# Patient Record
Sex: Male | Born: 1995
Health system: Southern US, Community
[De-identification: ages and names within clinical notes are randomized; demographics above are authoritative.]

## PROBLEM LIST (undated history)

## (undated) DIAGNOSIS — J45909 Unspecified asthma, uncomplicated: Secondary | ICD-10-CM

## (undated) DIAGNOSIS — Q549 Hypospadias, unspecified: Secondary | ICD-10-CM

## (undated) DIAGNOSIS — R112 Nausea with vomiting, unspecified: Secondary | ICD-10-CM

## (undated) DIAGNOSIS — B019 Varicella without complication: Secondary | ICD-10-CM

## (undated) HISTORY — DX: Nausea with vomiting, unspecified: R11.2

## (undated) HISTORY — DX: Hypospadias, unspecified: Q54.9

## (undated) HISTORY — PX: TONSILLECTOMY: SUR1361

## (undated) HISTORY — PX: ADENOIDECTOMY: SUR15

---
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
## 2016-03-19 ENCOUNTER — Encounter (HOSPITAL_BASED_OUTPATIENT_CLINIC_OR_DEPARTMENT_OTHER): Payer: Self-pay | Admitting: *Deleted

## 2016-03-19 DIAGNOSIS — J45909 Unspecified asthma, uncomplicated: Secondary | ICD-10-CM | POA: Insufficient documentation

## 2016-03-19 DIAGNOSIS — Z7722 Contact with and (suspected) exposure to environmental tobacco smoke (acute) (chronic): Secondary | ICD-10-CM | POA: Insufficient documentation

## 2016-03-19 DIAGNOSIS — L02211 Cutaneous abscess of abdominal wall: Secondary | ICD-10-CM | POA: Insufficient documentation

## 2016-03-19 NOTE — ED Triage Notes (Signed)
Abscess to waistline x 1 week.  Denies fever.

## 2016-03-20 ENCOUNTER — Emergency Department (HOSPITAL_BASED_OUTPATIENT_CLINIC_OR_DEPARTMENT_OTHER)
Admission: EM | Admit: 2016-03-20 | Discharge: 2016-03-20 | Disposition: A | Discharge status: Home / Self Care | Payer: Medicaid Other | Attending: Emergency Medicine | Admitting: Emergency Medicine

## 2016-03-20 DIAGNOSIS — L02211 Cutaneous abscess of abdominal wall: Secondary | ICD-10-CM

## 2016-03-20 MED ORDER — MUPIROCIN CALCIUM 2 % EX CREA
TOPICAL_CREAM | Freq: Three times a day (TID) | CUTANEOUS | Status: DC
  Filled 2016-03-20: qty 15

## 2016-03-20 MED ORDER — LIDOCAINE HCL 2 % IJ SOLN
10.0000 mL | Freq: Once | INTRAMUSCULAR | Status: AC
  Administered 2016-03-20: 200 mg
  Filled 2016-03-20: qty 20

## 2016-03-20 MED ORDER — HYDROCODONE-ACETAMINOPHEN 5-325 MG PO TABS
1.0000 | ORAL_TABLET | Freq: Once | ORAL | Status: AC
  Administered 2016-03-20: 1 via ORAL
  Filled 2016-03-20: qty 1

## 2016-03-20 MED ORDER — MUPIROCIN 2 % EX OINT
TOPICAL_OINTMENT | CUTANEOUS | Status: AC
  Administered 2016-03-20: 1
  Filled 2016-03-20: qty 22

## 2016-03-20 NOTE — ED Provider Notes (Signed)
[facility] Provider Note: Lowella Dell, MD, FACEP  CSN: [id number] MRN: [id number] ARRIVAL: 03/19/16 at 2123 ROOM: MH01/MH01   CHIEF COMPLAINT  Abscess   HISTORY OF PRESENT ILLNESS  Kevin Bryan is a 21 y.o. male with a tender swollen area on his midline abdomen at about the belt line. It is been there about a week. He has tried to squeeze it but has not got any drainage from it. Pain is moderate, worse with movement or palpation. He denies fever.   Past Medical History:  Diagnosis Date  . Asthma   . Hypospadias   . Nausea and vomiting   . Varicella     Past Surgical History:  Procedure Laterality Date  . ADENOIDECTOMY    . ESOPHAGOGASTRODUODENOSCOPY  03/01/2012   Procedure: ESOPHAGOGASTRODUODENOSCOPY (EGD);  Surgeon: Jon Gills, MD;  Location[facility] OR;  Service: Gastroenterology;  Laterality: N/A;  . TONSILLECTOMY      Family History  Problem Relation Age of Onset  . Cholelithiasis Maternal Aunt   . Learning disabilities Sister   . Asthma Mother   . Depression Mother   . Miscarriages / [place] Mother   . Asthma Maternal Grandmother   . Cancer Maternal Grandmother   . Heart disease Maternal Grandmother   . Diabetes Maternal Grandmother   . Alcohol abuse Maternal Grandfather   . Cancer Maternal Grandfather   . Arthritis Maternal Grandfather   . Heart disease Maternal Grandfather   . Diabetes Maternal Grandfather   . Hyperlipidemia Maternal Grandfather   . Hypertension Maternal Grandfather     Social History  Substance Use Topics  . Smoking status: Passive Smoke Exposure - Never Smoker  . Smokeless tobacco: Never Used     Comment: mom smokes   . Alcohol use 0.0 oz/week    Prior to Admission medications   Not on File    Allergies Bee venom and Food   REVIEW OF SYSTEMS  Negative except as noted here or in the History of Present Illness.   PHYSICAL EXAMINATION  Initial Vital Signs Blood pressure 125/63, pulse 72, temperature  98.1 F (36.7 C), temperature source Oral, resp. rate 20, height 5\' 7"  (1.702 m), weight 140 lb (63.5 kg), SpO2 100 %.  Examination General: Well-developed, well-nourished male in no acute distress; appearance consistent with age of record HENT: normocephalic; atraumatic Eyes: Normal appearance Neck: supple Heart: regular rate and rhythm Lungs: clear to auscultation bilaterally Abdomen: soft; nondistended; nontender; no masses or hepatosplenomegaly; bowel sounds present Extremities: No deformity; full range of motion; pulses normal Neurologic: Awake, alert and oriented; motor function intact in all extremities and symmetric; no facial droop Skin: Warm and dry; small fluctuant abscess midabdomen at beltline Psychiatric: Normal mood and affect   RESULTS  Summary of this visit's results, reviewed by myself:   EKG Interpretation  Date/Time:    Ventricular Rate:    PR Interval:    QRS Duration:   QT Interval:    QTC Calculation:   R Axis:     Text Interpretation:        Laboratory Studies: No results found for this or any previous visit (from the past 24 hour(s)). Imaging Studies: No results found.  ED COURSE  Nursing notes and initial vitals signs, including pulse oximetry, reviewed.  Vitals:   03/19/16 2138 03/20/16 0132  BP: 127/66 125/63  Pulse: 79 72  Resp: 20   Temp: 98.1 F (36.7 C)   TempSrc: Oral   SpO2: 100% 100%  Weight: 140 lb (63.5 kg)   Height: 5\' 7"  (1.702 m)     PROCEDURES   INCISION AND DRAINAGE Performed by: Paula LibraMOLPUS,Joziyah Roblero L Consent: Verbal consent obtained. Risks and benefits: risks, benefits and alternatives were discussed Type: abscess  Body area: Abdomen  Anesthesia: local infiltration  Incision was made with a scalpel.  Local anesthetic: lidocaine 2 % without epinephrine  Anesthetic total: One ml  Complexity: complex Blunt dissection to break up loculations  Drainage: purulent  Drainage amount: Small   Packing material:  None   Patient tolerance: Patient tolerated the procedure well with no immediate complications.     ED DIAGNOSES     ICD-9-CM ICD-10-CM   1. Abscess of abdominal wall 682.2 L02.211        Paula LibraJohn Joron Velis, MD 03/20/16 [phone]

## 2016-03-20 NOTE — ED Notes (Signed)
EDP into room 

## 2016-04-11 ENCOUNTER — Encounter: Payer: Self-pay | Admitting: Pediatrics

## 2016-04-11 ENCOUNTER — Ambulatory Visit (INDEPENDENT_AMBULATORY_CARE_PROVIDER_SITE_OTHER): Payer: BLUE CROSS/BLUE SHIELD | Admitting: Pediatrics

## 2016-04-11 VITALS — BP 106/60 | Temp 98.1°F | Wt 143.4 lb

## 2016-04-11 DIAGNOSIS — J019 Acute sinusitis, unspecified: Secondary | ICD-10-CM | POA: Diagnosis not present

## 2016-04-11 NOTE — Progress Notes (Signed)
History was provided by the patient.  Kevin Bryan is a 21 y.o. male who is here for congestion, sinus pressure x 3 days.     HPI:   Kevin Bryan reports that he has had sinus pressure since the evening of 1/27. He reports an intermittent headache, especially when he blows his nose, congestion, and a sore throat. No cough, fevers, myalgias, joint pains, new rashes. He had one bout of NBNB emesis yesterday. He has not taken any medication, but has tried essential oils (lemon, peppermint), which has helped some. He would like a note for work, as he works as a Education administratorpainter, where wears a mask and is afraid he may pass out if he is very congested and has difficulty breathing. No sick contacts.     Patient Active Problem List   Diagnosis Date Noted  . 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 Family disruption 11/05/2012  . H/O chlamydia infection 10/09/2012  . GERD (gastroesophageal reflux disease) 10/04/2012  . Headache(784.0) 01/11/2012    The following portions of the patient's history were reviewed and updated as appropriate: allergies, current medications, past family history, past medical history, past social history, past surgical history and problem list.  Physical Exam:    Vitals:   04/11/16 0931  BP: 106/60  Temp: 98.1 F (36.7 C)  TempSrc: Oral  Weight: 143 lb 6.4 oz (65 kg)   Growth parameters are noted and are appropriate for age.    General:   alert teenage male, well appearing, no acute distress  Gait:   normal  Skin:   normal and healed boil noted on lower abdomen, no drainage or surrounding erythema   Oral cavity:   lips, mucosa, and tongue normal; teeth and gums normal; posterior oropharynx clear with no exudates  Eyes:   sclerae white, pupils equal and reactive  Ears:   normal bilaterally  Neck:   no adenopathy and thyroid not enlarged, symmetric, no tenderness/mass/nodules  Lungs:  clear to auscultation bilaterally  Heart:   regular rate and rhythm, S1, S2 normal, no  murmur, click, rub or gallop  Abdomen:  soft, non-tender; bowel sounds normal; no masses,  no organomegaly  GU:  not examined  Extremities:   extremities normal, atraumatic, no cyanosis or edema  Neuro:  normal without focal findings      Assessment/Plan: Kevin Bryan is a 21 yo male presenting with three days of congestion, head ache, and sinus pressure, likely acute viral rhinosinusitis. He has had no fevers, myalgias. On exam, he is well appearing, with clear lungs, clear oropharynx, transilluminated sinuses. He has mild pain with palpation over sinuses.  Rhinosinusitis - discussed symptom management with saline irrigation, hydration, ibuprofen - return precautions discussed; will return if symptoms are not improving after 7 days  - Immunizations today: none  - Patient will call for annual physical or [facility]

## 2016-04-11 NOTE — Patient Instructions (Signed)
[facility]  [address]  [phone]

## 2016-10-18 ENCOUNTER — Emergency Department (HOSPITAL_BASED_OUTPATIENT_CLINIC_OR_DEPARTMENT_OTHER)
Admission: EM | Admit: 2016-10-18 | Discharge: 2016-10-18 | Disposition: A | Discharge status: Home / Self Care | Payer: No Typology Code available for payment source | Attending: Emergency Medicine | Admitting: Emergency Medicine

## 2016-10-18 DIAGNOSIS — R1030 Lower abdominal pain, unspecified: Secondary | ICD-10-CM

## 2016-10-18 DIAGNOSIS — A0839 Other viral enteritis: Secondary | ICD-10-CM | POA: Insufficient documentation

## 2016-10-18 DIAGNOSIS — R197 Diarrhea, unspecified: Secondary | ICD-10-CM | POA: Insufficient documentation

## 2016-10-18 DIAGNOSIS — J45909 Unspecified asthma, uncomplicated: Secondary | ICD-10-CM | POA: Insufficient documentation

## 2016-10-18 DIAGNOSIS — A084 Viral intestinal infection, unspecified: Secondary | ICD-10-CM

## 2016-10-18 DIAGNOSIS — R112 Nausea with vomiting, unspecified: Secondary | ICD-10-CM | POA: Diagnosis present

## 2016-10-18 LAB — URINALYSIS, ROUTINE W REFLEX MICROSCOPIC
Bilirubin Urine: NEGATIVE
Glucose, UA: NEGATIVE mg/dL
Hgb urine dipstick: NEGATIVE
Ketones, ur: NEGATIVE mg/dL
Leukocytes, UA: NEGATIVE
Nitrite: NEGATIVE
Protein, ur: NEGATIVE mg/dL
Specific Gravity, Urine: 1.005 (ref 1.005–1.030)
pH: 7.5 (ref 5.0–8.0)

## 2016-10-18 LAB — COMPREHENSIVE METABOLIC PANEL
ALT: 18 U/L (ref 17–63)
AST: 22 U/L (ref 15–41)
Albumin: 4.5 g/dL (ref 3.5–5.0)
Alkaline Phosphatase: 81 U/L (ref 38–126)
Anion gap: 8 (ref 5–15)
BUN: 13 mg/dL (ref 6–20)
CO2: 27 mmol/L (ref 22–32)
Calcium: 9.3 mg/dL (ref 8.9–10.3)
Chloride: 103 mmol/L (ref 101–111)
Creatinine, Ser: 0.97 mg/dL (ref 0.61–1.24)
GFR calc Af Amer: >60 mL/min (ref >60)
GFR calc non Af Amer: >60 mL/min (ref >60)
Glucose, Bld: 99 mg/dL (ref 65–99)
Potassium: 3.6 mmol/L (ref 3.5–5.1)
Sodium: 138 mmol/L (ref 135–145)
Total Bilirubin: 1.2 mg/dL (ref 0.3–1.2)
Total Protein: 7.3 g/dL (ref 6.5–8.1)

## 2016-10-18 LAB — CBC
HCT: 40.1 % (ref 39.0–52.0)
Hemoglobin: 13.6 g/dL (ref 13.0–17.0)
MCH: 30.2 pg (ref 26.0–34.0)
MCHC: 33.9 g/dL (ref 30.0–36.0)
MCV: 89.1 fL (ref 78.0–100.0)
Platelets: 174 10*3/uL (ref 150–400)
RBC: 4.5 MIL/uL (ref 4.22–5.81)
RDW: 12.2 % (ref 11.5–15.5)
WBC: 7.2 10*3/uL (ref 4.0–10.5)

## 2016-10-18 LAB — LIPASE, BLOOD: Lipase: 26 U/L (ref 11–51)

## 2016-10-18 MED ORDER — SODIUM CHLORIDE 0.9 % IV BOLUS (SEPSIS)
1000.0000 mL | Freq: Once | INTRAVENOUS | Status: AC
  Administered 2016-10-18: 1000 mL via INTRAVENOUS

## 2016-10-18 MED ORDER — ONDANSETRON 4 MG PO TBDP: 4.0000 mg | ORAL_TABLET | Freq: Three times a day (TID) | ORAL | 0 refills | Status: DC | PRN

## 2016-10-18 NOTE — ED Provider Notes (Signed)
[facility] Provider Note   CSN: [id number] Arrival date & time: 10/18/16  1029     History   Chief Complaint No chief complaint on file.   HPI Kevin Bryan is a 21 y.o. male with a PMHx of asthma, GERD, and headaches, who presents to the ED with complaints of nausea, vomiting, and diarrhea that began yesterday around 8 AM while he was at work. Patient states that he had a total of 3 episodes of nonbloody nonbilious emesis yesterday, but has had no ongoing emesis today and denies any ongoing nausea aside from when he has waves of abdominal pain. He has had a total of about 4-5 nonbloody greenish soft/looser than normal episodes of diarrhea since yesterday. He states that he has occasional waves of abdominal cramping in the lower abdomen, which he describes as mild 5/10 intermittent nonradiating pain that worsens with movement and he has not tried anything for his pain or his symptoms. States currently he has no pain, because it only happens in "waves". [facility] to arrival and was told to come here because he says when the provider did her exam he was tender in the right lower quadrant so they wanted him to "have his appendix checked out". He denies any recent travel, suspicious food intake, sick contacts, alcohol use, chronic NSAID use, or prior abdominal surgeries. He denies fevers, chills, CP, SOB, constipation, obstipation, melena, hematochezia, hematemesis, hematuria, dysuria, myalgias, arthralgias, numbness, tingling, focal weakness, or any other complaints at this time.    The history is provided by the patient and medical records. No language interpreter was used.  Diarrhea   This is a new problem. The current episode started yesterday. The problem occurs 2 to 4 times per day. The problem has not changed since onset.Diarrhea characteristics: loose. There has been no fever. Associated symptoms include abdominal pain and vomiting. Pertinent  negatives include no chills, no arthralgias and no myalgias. He has tried nothing for the symptoms. The treatment provided no relief.  Abdominal Pain   This is a new problem. The current episode started yesterday. Episode frequency: intermittent. Progression since onset: waxing/waning. The pain is associated with an unknown factor. The pain is located in the RLQ, LLQ and suprapubic region. The quality of the pain is cramping. The pain is at a severity of 5/10. The pain is mild. Associated symptoms include diarrhea, nausea and vomiting. Pertinent negatives include fever, flatus, hematochezia, melena, constipation, dysuria, hematuria, arthralgias and myalgias. The symptoms are aggravated by activity. Nothing relieves the symptoms.    Past Medical History:  Diagnosis Date  . Asthma   . Hypospadias   . Nausea and vomiting   . Varicella     Patient Active Problem List   Diagnosis Date Noted  . 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 Family disruption 11/05/2012  . H/O chlamydia infection 10/09/2012  . GERD (gastroesophageal reflux disease) 10/04/2012  . Headache(784.0) 01/11/2012    Past Surgical History:  Procedure Laterality Date  . ADENOIDECTOMY    . ESOPHAGOGASTRODUODENOSCOPY  03/01/2012   Procedure: ESOPHAGOGASTRODUODENOSCOPY (EGD);  Surgeon: Jon GillsJoseph H Clark, MD;  [facility];  Service: Gastroenterology;  Laterality: N/A;  . TONSILLECTOMY         Home Medications    Prior to Admission medications   Not on File    Family History Family History  Problem Relation Age of Onset  . Cholelithiasis Maternal Aunt   . Learning disabilities Sister   .  Asthma Mother   . Depression Mother   . Miscarriages / [place] Mother   . Asthma Maternal Grandmother   . Cancer Maternal Grandmother   . Heart disease Maternal Grandmother   . Diabetes Maternal Grandmother   . Alcohol abuse Maternal Grandfather   . Cancer Maternal Grandfather   . Arthritis Maternal Grandfather   .  Heart disease Maternal Grandfather   . Diabetes Maternal Grandfather   . Hyperlipidemia Maternal Grandfather   . Hypertension Maternal Grandfather     Social History Social History  Substance Use Topics  . Smoking status: Passive Smoke Exposure - Never Smoker  . Smokeless tobacco: Never Used     Comment: mom smokes   . Alcohol use 0.0 oz/week     Allergies   Bee venom and Food   Review of Systems Review of Systems  Constitutional: Negative for chills and fever.  Respiratory: Negative for shortness of breath.   Cardiovascular: Negative for chest pain.  Gastrointestinal: Positive for abdominal pain, diarrhea, nausea and vomiting. Negative for blood in stool, constipation, flatus, hematochezia and melena.  Genitourinary: Negative for dysuria and hematuria.  Musculoskeletal: Negative for arthralgias and myalgias.  Skin: Negative for color change.  Allergic/Immunologic: Negative for immunocompromised state.  Neurological: Negative for weakness and numbness.  Psychiatric/Behavioral: Negative for confusion.   All other systems reviewed and are negative for acute change except as noted in the HPI.    Physical Exam Updated Vital Signs BP 126/72 (BP Location: Left Arm)   Pulse (!) 52   Temp 98 F (36.7 C) (Oral)   Resp 18   Ht 5\' 6"  (1.676 m)   Wt 68 kg (150 lb)   SpO2 100%   BMI 24.21 kg/m   Physical Exam  Constitutional: He is oriented to person, place, and time. Vital signs are normal. He appears well-developed and well-nourished.  Non-toxic appearance. No distress.  Afebrile, nontoxic, NAD, sitting upright drinking mt dew, laughing with family at bedside  HENT:  Head: Normocephalic and atraumatic.  Mouth/Throat: Oropharynx is clear and moist and mucous membranes are normal.  Eyes: Conjunctivae and EOM are normal. Right eye exhibits no discharge. Left eye exhibits no discharge.  Neck: Normal range of motion. Neck supple.  Cardiovascular: Normal rate, regular rhythm,  normal heart sounds and intact distal pulses.  Exam reveals no gallop and no friction rub.   No murmur heard. Pulmonary/Chest: Effort normal and breath sounds normal. No respiratory distress. He has no decreased breath sounds. He has no wheezes. He has no rhonchi. He has no rales.  Abdominal: Soft. Normal appearance and bowel sounds are normal. He exhibits no distension. There is tenderness in the right upper quadrant and right lower quadrant. There is no rigidity, no rebound, no guarding, no CVA tenderness, no tenderness at McBurney's point and negative Murphy's sign.    Soft, nondistended, +BS throughout, with very minimal TTP to R lateral abdomen with deep palpation, but no focal RLQ/mcburney's TTP, neg psoas sign, neg foot tap test, no r/g/r, neg murphy's, no CVA TTP   Musculoskeletal: Normal range of motion.  Neurological: He is alert and oriented to person, place, and time. He has normal strength. No sensory deficit.  Skin: Skin is warm, dry and intact. No rash noted.  Psychiatric: He has a normal mood and affect.  Nursing note and vitals reviewed.    ED Treatments / Results  Labs (all labs ordered are listed, but only abnormal results are displayed) Labs Reviewed  LIPASE, BLOOD  COMPREHENSIVE  METABOLIC PANEL  CBC  URINALYSIS, ROUTINE W REFLEX MICROSCOPIC    EKG  EKG Interpretation None       Radiology No results found.  Procedures Procedures (including critical care time)  Medications Ordered in ED Medications  sodium chloride 0.9 % bolus 1,000 mL (1,000 mLs Intravenous New Bag/Given 10/18/16 1331)     Initial Impression / Assessment and Plan / ED Course  I have reviewed the triage vital signs and the nursing notes.  Pertinent labs & imaging results that were available during my care of the patient were reviewed by me and considered in my medical decision making (see chart for details).     21 y.o. male here for n/v/d x1 day and lower abd cramping  intermittently. Sent here from [facility] to "check his appendix" because when they examined him, his RLQ had some tenderness. On exam, pt sitting up in NAD, drinking a mt dew, laughing with significant other at bedside; abd exam with no distension, adequate bowel sounds, very mild TTP to R lateral abdomen but no mcburney's point TTP, nonperitoneal, neg psoas/foot tap test. Afebrile and nontoxic. History and exam highly inconsistent with appendicitis, more consistent with gastroenteritis/colitis. Shared decision making conversation had regarding options of labs only then decide on any imaging, or doing imaging immediately despite my low suspicion for appendicitis or acute emergent pathology; pt states he'd rather just wait for labs. I feel this is the most appropriate next step. Will await labs. Pt declines wanting anything for pain or nausea as these are both currently resolved.   3:30 PM Delays due to downtime, and due to pt not getting fluids given until fairly late in his ED stay. Pt continues to feel improved, and no ongoing n/v/pain, tolerating PO well. CBC WNL, no leukocytosis; CMP WNL, lipase WNL, U/A completely unremarkable. Given reassuring work up, and lack of clinical findings to be concerned about appendicitis, and with story not matching well with that, discussed again with pt that we could watch/wait vs scan today. Pt again opted for holding off on any imaging studies and watch/wait. Symptoms most consistent with viral gastroenteritis/colitis. Will send home with zofran rx, advised staying hydrated, BRAT diet, tylenol/motrin for pain, and f/up with PCP in 3-5 days for recheck of symptoms. Strict return precautions advised. I explained the diagnosis and have given explicit precautions to return to the ER including for any other new or worsening symptoms. The patient understands and accepts the medical plan as it's been dictated and I have answered their questions. Discharge instructions  concerning home care and prescriptions have been given. The patient is STABLE and is discharged to home in good condition.    Final Clinical Impressions(s) / ED Diagnoses   Final diagnoses:  Nausea vomiting and diarrhea  Lower abdominal pain  Viral gastroenteritis    New Prescriptions New Prescriptions   ONDANSETRON (ZOFRAN ODT) 4 MG DISINTEGRATING TABLET    Take 1 tablet (4 mg total) by mouth every 8 (eight) hours as needed for nausea or vomiting.     [address], [place], [place] 10/18/16 1718    Blane Ohara, MD 10/20/16 1010

## 2016-10-18 NOTE — ED Notes (Signed)
ED Provider at bedside. 

## 2016-10-18 NOTE — Discharge Instructions (Signed)
Your labs today are reassuring, your symptoms are most likely due to a viral gastroenteritis/colitis. Use zofran as prescribed, as needed for nausea. Alternate between tylenol and motrin as needed for pain. Stay well hydrated with small sips of fluids throughout the day. Follow a BRAT (banana-rice-applesauce-toast) diet as described below for the next 24-48 hours. The 'BRAT' diet is suggested, then progress to diet as tolerated as symptoms abate. Call your regular doctor if bloody stools, persistent diarrhea, persisting vomiting, fever, or worsening abdominal pain. Follow up with your regular doctor in 3-5 days for recheck of symptoms. Return to ER for changing or worsening of symptoms.

## 2016-10-18 NOTE — ED Triage Notes (Signed)
Patient states that he is was having N/V/D since last night. Denies any right now. Went to an Urgent care and when the Provider pushed on his right lower quad it hurt

## 2016-10-18 NOTE — ED Notes (Signed)
SEE DOWNTIME FORMS 

## 2016-12-29 ENCOUNTER — Other Ambulatory Visit (HOSPITAL_COMMUNITY): Payer: Self-pay

## 2018-07-15 ENCOUNTER — Other Ambulatory Visit: Payer: Self-pay

## 2018-07-15 ENCOUNTER — Emergency Department (HOSPITAL_BASED_OUTPATIENT_CLINIC_OR_DEPARTMENT_OTHER)
Admission: EM | Admit: 2018-07-15 | Discharge: 2018-07-15 | Disposition: A | Discharge status: Home / Self Care | Payer: No Typology Code available for payment source | Attending: Emergency Medicine | Admitting: Emergency Medicine

## 2018-07-15 ENCOUNTER — Encounter (HOSPITAL_BASED_OUTPATIENT_CLINIC_OR_DEPARTMENT_OTHER): Payer: Self-pay

## 2018-07-15 DIAGNOSIS — Z7722 Contact with and (suspected) exposure to environmental tobacco smoke (acute) (chronic): Secondary | ICD-10-CM | POA: Insufficient documentation

## 2018-07-15 DIAGNOSIS — J45909 Unspecified asthma, uncomplicated: Secondary | ICD-10-CM | POA: Insufficient documentation

## 2018-07-15 DIAGNOSIS — R197 Diarrhea, unspecified: Secondary | ICD-10-CM | POA: Insufficient documentation

## 2018-07-15 DIAGNOSIS — R1030 Lower abdominal pain, unspecified: Secondary | ICD-10-CM

## 2018-07-15 DIAGNOSIS — R112 Nausea with vomiting, unspecified: Secondary | ICD-10-CM

## 2018-07-15 LAB — URINALYSIS, ROUTINE W REFLEX MICROSCOPIC
Bilirubin Urine: NEGATIVE
Glucose, UA: NEGATIVE mg/dL
Hgb urine dipstick: NEGATIVE
Ketones, ur: NEGATIVE mg/dL
Leukocytes,Ua: NEGATIVE
Nitrite: NEGATIVE
Protein, ur: NEGATIVE mg/dL
Specific Gravity, Urine: 1.01 (ref 1.005–1.030)
pH: 7 (ref 5.0–8.0)

## 2018-07-15 LAB — CBC WITH DIFFERENTIAL/PLATELET
Abs Immature Granulocytes: 0.01 10*3/uL (ref 0.00–0.07)
Basophils Absolute: 0 10*3/uL (ref 0.0–0.1)
Basophils Relative: 0 %
Eosinophils Absolute: 0.1 10*3/uL (ref 0.0–0.5)
Eosinophils Relative: 1 %
HCT: 41.3 % (ref 39.0–52.0)
Hemoglobin: 13.6 g/dL (ref 13.0–17.0)
Immature Granulocytes: 0 %
Lymphocytes Relative: 26 %
Lymphs Abs: 2.1 10*3/uL (ref 0.7–4.0)
MCH: 29.7 pg (ref 26.0–34.0)
MCHC: 32.9 g/dL (ref 30.0–36.0)
MCV: 90.2 fL (ref 80.0–100.0)
Monocytes Absolute: 0.6 10*3/uL (ref 0.1–1.0)
Monocytes Relative: 7 %
Neutro Abs: 5.2 10*3/uL (ref 1.7–7.7)
Neutrophils Relative %: 66 %
Platelets: 210 10*3/uL (ref 150–400)
RBC: 4.58 MIL/uL (ref 4.22–5.81)
RDW: 12 % (ref 11.5–15.5)
WBC: 8 10*3/uL (ref 4.0–10.5)
nRBC: 0 % (ref 0.0–0.2)

## 2018-07-15 LAB — COMPREHENSIVE METABOLIC PANEL
ALT: 21 U/L (ref 0–44)
AST: 22 U/L (ref 15–41)
Albumin: 4.3 g/dL (ref 3.5–5.0)
Alkaline Phosphatase: 76 U/L (ref 38–126)
Anion gap: 5 (ref 5–15)
BUN: 16 mg/dL (ref 6–20)
CO2: 28 mmol/L (ref 22–32)
Calcium: 9.2 mg/dL (ref 8.9–10.3)
Chloride: 105 mmol/L (ref 98–111)
Creatinine, Ser: 1.12 mg/dL (ref 0.61–1.24)
GFR calc Af Amer: >60 mL/min (ref >60)
GFR calc non Af Amer: >60 mL/min (ref >60)
Glucose, Bld: 107 mg/dL — ABNORMAL HIGH (ref 70–99)
Potassium: 3.7 mmol/L (ref 3.5–5.1)
Sodium: 138 mmol/L (ref 135–145)
Total Bilirubin: 0.8 mg/dL (ref 0.3–1.2)
Total Protein: 7.3 g/dL (ref 6.5–8.1)

## 2018-07-15 LAB — LIPASE, BLOOD: Lipase: 31 U/L (ref 11–51)

## 2018-07-15 MED ORDER — DICYCLOMINE HCL 10 MG/ML IM SOLN
20.0000 mg | Freq: Once | INTRAMUSCULAR | Status: AC
  Administered 2018-07-15: 20 mg via INTRAMUSCULAR
  Filled 2018-07-15: qty 2

## 2018-07-15 MED ORDER — ONDANSETRON 4 MG PO TBDP: ORAL_TABLET | ORAL | 0 refills | Status: DC

## 2018-07-15 MED ORDER — DICYCLOMINE HCL 20 MG PO TABS: 20.0000 mg | ORAL_TABLET | Freq: Two times a day (BID) | ORAL | 0 refills | Status: DC

## 2018-07-15 MED ORDER — KETOROLAC TROMETHAMINE 30 MG/ML IJ SOLN
30.0000 mg | Freq: Once | INTRAMUSCULAR | Status: AC
  Administered 2018-07-15: 30 mg via INTRAVENOUS
  Filled 2018-07-15: qty 1

## 2018-07-15 MED ORDER — FAMOTIDINE IN NACL 20-0.9 MG/50ML-% IV SOLN
20.0000 mg | Freq: Once | INTRAVENOUS | Status: AC
  Administered 2018-07-15: 20 mg via INTRAVENOUS
  Filled 2018-07-15: qty 50

## 2018-07-15 MED ORDER — ONDANSETRON HCL 4 MG/2ML IJ SOLN
4.0000 mg | Freq: Once | INTRAMUSCULAR | Status: AC
  Administered 2018-07-15: 4 mg via INTRAVENOUS
  Filled 2018-07-15: qty 2

## 2018-07-15 MED ORDER — ALUM & MAG HYDROXIDE-SIMETH 200-200-20 MG/5ML PO SUSP
30.0000 mL | Freq: Once | ORAL | Status: AC
  Administered 2018-07-15: 30 mL via ORAL
  Filled 2018-07-15: qty 30

## 2018-07-15 MED ORDER — SODIUM CHLORIDE 0.9 % IV BOLUS
1000.0000 mL | Freq: Once | INTRAVENOUS | Status: AC
  Administered 2018-07-15: 1000 mL via INTRAVENOUS

## 2018-07-15 NOTE — Discharge Instructions (Signed)
Your labs today are reassuring, I suspect you are symptoms may be related to viral gastroenteritis.  Please use Zofran as needed for nausea and vomiting, Bentyl for cramping abdominal pain, you can also take over-the-counter Maalox.  Monitor your symptoms closely if you are having worsening abdominal pain especially if it localizes to one focal area, if your abdomen feels hard or distended, you develop fevers, persistent vomiting, blood in your vomit or stools or any other new or concerning symptoms please return to the emergency department for reevaluation.

## 2018-07-15 NOTE — ED Notes (Signed)
Pt provided fluids for po challenge.  No vomiting, does remain slightly nauseous

## 2018-07-15 NOTE — ED Provider Notes (Signed)
[facility] EMERGENCY DEPARTMENT Provider Note   CSN: [id number] Arrival date & time: 07/15/18  1128    History   Chief Complaint Chief Complaint  Patient presents with  . Emesis  . Abdominal Pain    HPI Kevin Bryan is a 23 y.o. male.     Kevin Bryan is a 23 y.o. male with a history of asthma, who presents to the emergency department for evaluation of generalized abdominal pain, nausea vomiting and diarrhea.  Symptoms started suddenly when he woke up this morning.  He reported generalized cramping abdominal pain that was initially worse in the upper quadrants and then seemed to migrate to the lower abdomen.  He reports that he has had 4 episodes of nonbloody nonbilious emesis since waking up this morning and has had 1 large loose bowel movement with no blood.  He reports pain has persisted it gets worse right before he feels like he is going to vomit he has had no vomiting since arriving in the emergency department.  He denies any fevers or chills.  No chest pain or shortness of breath.  He does report that symptoms began after he ate a lot of food last night at a large cookout.  He does report that he ate a rare steak.  He is unsure if anyone else has similar symptoms.  He was noted to be drinking soda on arrival to the emergency department.  He denies any heavy NSAID use, denies any alcohol use, no drug use.  No prior abdominal surgeries.     Past Medical History:  Diagnosis Date  . Asthma   . Hypospadias   . Nausea and vomiting   . Varicella     Patient Active Problem List   Diagnosis Date Noted  . 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 Family disruption 11/05/2012  . H/O chlamydia infection 10/09/2012  . GERD (gastroesophageal reflux disease) 10/04/2012  . Headache(784.0) 01/11/2012    Past Surgical History:  Procedure Laterality Date  . ADENOIDECTOMY    . ESOPHAGOGASTRODUODENOSCOPY  03/01/2012   Procedure: ESOPHAGOGASTRODUODENOSCOPY (EGD);  Surgeon:  Jon GillsJoseph H Clark, MD;  [facility];  Service: Gastroenterology;  Laterality: N/A;  . TONSILLECTOMY          Home Medications    Prior to Admission medications   Medication Sig Start Date End Date Taking? Authorizing Provider  dicyclomine (BENTYL) 20 MG tablet Take 1 tablet (20 mg total) by mouth 2 (two) times daily. 07/15/18   Dartha LodgeFord, Tillmon Kisling N, PA-C  ondansetron (ZOFRAN ODT) 4 MG disintegrating tablet 4mg  ODT q4 hours prn nausea/vomit 07/15/18   Dartha LodgeFord, Shiloh Southern N, PA-C    Family History Family History  Problem Relation Age of Onset  . Cholelithiasis Maternal Aunt   . Learning disabilities Sister   . Asthma Mother   . Depression Mother   . Miscarriages / [place] Mother   . Asthma Maternal Grandmother   . Cancer Maternal Grandmother   . Heart disease Maternal Grandmother   . Diabetes Maternal Grandmother   . Alcohol abuse Maternal Grandfather   . Cancer Maternal Grandfather   . Arthritis Maternal Grandfather   . Heart disease Maternal Grandfather   . Diabetes Maternal Grandfather   . Hyperlipidemia Maternal Grandfather   . Hypertension Maternal Grandfather     Social History Social History   Tobacco Use  . Smoking status: Passive Smoke Exposure - Never Smoker  . Smokeless tobacco: Never Used  . Tobacco comment: mom smokes   Substance  Use Topics  . Alcohol use: Yes    Alcohol/week: 0.0 standard drinks  . Drug use: Yes    Types: Marijuana    Comment: Tried marijuana once age 45     Allergies   Bee venom and Food   Review of Systems Review of Systems  Constitutional: Negative for chills and fever.  HENT: Negative.   Respiratory: Negative for cough and shortness of breath.   Cardiovascular: Negative for chest pain.  Gastrointestinal: Positive for abdominal pain, diarrhea, nausea and vomiting. Negative for blood in stool and constipation.  Genitourinary: Negative for dysuria and frequency.  Musculoskeletal: Negative for arthralgias and myalgias.  Skin: Negative  for color change and rash.  Neurological: Negative for dizziness, syncope and light-headedness.  All other systems reviewed and are negative.    Physical Exam Updated Vital Signs BP 128/76   Pulse 81   Temp 98.4 F (36.9 C) (Oral)   Resp 16   Ht  (1.676 m)   Wt 73.5 kg   SpO2 100%   BMI 26.15 kg/m   Physical Exam Vitals signs and nursing note reviewed.  Constitutional:      General: He is not in acute distress.    Appearance: He is well-developed and normal weight. He is not ill-appearing or diaphoretic.  HENT:     Head: Normocephalic and atraumatic.     Mouth/Throat:     Mouth: Mucous membranes are moist.     Pharynx: Oropharynx is clear.  Eyes:     General:        Right eye: No discharge.        Left eye: No discharge.     Pupils: Pupils are equal, round, and reactive to light.  Neck:     Musculoskeletal: Neck supple.  Cardiovascular:     Rate and Rhythm: Normal rate and regular rhythm.     Heart sounds: Normal heart sounds. No murmur. No friction rub. No gallop.   Pulmonary:     Effort: Pulmonary effort is normal. No respiratory distress.     Breath sounds: Normal breath sounds. No wheezing or rales.     Comments: Respirations equal and unlabored, patient able to speak in full sentences, lungs clear to auscultation bilaterally Abdominal:     General: Abdomen is flat. Bowel sounds are normal. There is no distension.     Palpations: Abdomen is soft. There is no mass.     Tenderness: There is generalized abdominal tenderness. There is no guarding.     Comments: Abdomen is soft, nondistended, bowel sounds present throughout, there is generalized tenderness throughout, most notable across the lower abdomen but there is no guarding or peritoneal signs, no Murphy sign or focal tenderness in the right lower quadrant.  Musculoskeletal:        General: No deformity.  Skin:    General: Skin is warm and dry.     Capillary Refill: Capillary refill takes less than 2  seconds.  Neurological:     Mental Status: He is alert and oriented to person, place, and time.     Coordination: Coordination normal.     Comments: Speech is clear, able to follow commands Moves extremities without ataxia, coordination intact  Psychiatric:        Mood and Affect: Mood normal.        Behavior: Behavior normal.      ED Treatments / Results  Labs (all labs ordered are listed, but only abnormal results are displayed) Labs Reviewed  COMPREHENSIVE  METABOLIC PANEL - Abnormal; Notable for the following components:      Result Value   Glucose, Bld 107 (*)    All other components within normal limits  LIPASE, BLOOD  CBC WITH DIFFERENTIAL/PLATELET  URINALYSIS, ROUTINE W REFLEX MICROSCOPIC    EKG None  Radiology No results found.  Procedures Procedures (including critical care time)  Medications Ordered in ED Medications  sodium chloride 0.9 % bolus 1,000 mL (0 mLs Intravenous Stopped 07/15/18 1319)  ondansetron (ZOFRAN) injection 4 mg (4 mg Intravenous Given 07/15/18 1212)  dicyclomine (BENTYL) injection 20 mg (20 mg Intramuscular Given 07/15/18 1218)  famotidine (PEPCID) IVPB 20 mg premix (0 mg Intravenous Stopped 07/15/18 1246)  alum & mag hydroxide-simeth (MAALOX/MYLANTA) 200-200-20 MG/5ML suspension 30 mL (30 mLs Oral Given 07/15/18 1302)  ketorolac (TORADOL) 30 MG/ML injection 30 mg (30 mg Intravenous Given 07/15/18 1302)     Initial Impression / Assessment and Plan / ED Course  I have reviewed the triage vital signs and the nursing notes.  Pertinent labs & imaging results that were available during my care of the patient were reviewed by me and considered in my medical decision making (see chart for details).  Patient presents with generalized abdominal pain with associated nausea vomiting and diarrhea which started this morning, patient was at a large cookout last night, suspect the patient may have viral gastroenteritis or foodborne illness.  He does not have any  focal area of tenderness or peritoneal signs on exam.  No focal right upper quadrant tenderness to suggest gallbladder pathology.  He has not had any vomiting since arriving here in the ED.  No hematemesis, melena or hematochezia.  No fevers or chills.  Normal vitals on arrival.  Will check abdominal labs but given reassuring abdominal exam do not feel that imaging is indicated at this time.  Will provide symptomatic treatment with IV fluids, Zofran, Bentyl, Pepcid and GI cocktail.  Labs are very reassuring, no leukocytosis, normal hemoglobin, no acute electrolyte derangements, normal renal and liver function and normal lipase, urinalysis without signs of infection.  On reevaluation patient does report some improvement in his pain and he has been able to tolerate fluids with no further episodes of emesis.  Abdominal exam remains reassuring with no focal area of tenderness or peritonitis.  At this time I feel he is stable for discharge home with continued symptomatic treatment, I have provided him with strict return precautions regarding worsening abdominal pain, fevers or persistent vomiting, he expresses understanding and agreement with this plan.  Discharged home in good condition.  Final Clinical Impressions(s) / ED Diagnoses   Final diagnoses:  Nausea vomiting and diarrhea  Lower abdominal pain    ED Discharge Orders         Ordered    ondansetron (ZOFRAN ODT) 4 MG disintegrating tablet     07/15/18 1419    dicyclomine (BENTYL) 20 MG tablet  2 times daily     07/15/18 1419           Dartha Lodge, [place] 07/15/18 1830    Little, Ambrose Finland, MD 07/16/18 [phone]

## 2018-07-15 NOTE — ED Triage Notes (Signed)
Beginning this morning, vomited, loose stool x1.  Abdominal pain right upper quadrant and epigastric area.  Drinking soda on arrival.

## 2018-08-26 ENCOUNTER — Emergency Department (HOSPITAL_BASED_OUTPATIENT_CLINIC_OR_DEPARTMENT_OTHER)
Admission: EM | Admit: 2018-08-26 | Discharge: 2018-08-26 | Disposition: A | Discharge status: Home / Self Care | Payer: Managed Care, Other (non HMO) | Attending: Emergency Medicine | Admitting: Emergency Medicine

## 2018-08-26 ENCOUNTER — Other Ambulatory Visit: Payer: Self-pay

## 2018-08-26 ENCOUNTER — Encounter (HOSPITAL_BASED_OUTPATIENT_CLINIC_OR_DEPARTMENT_OTHER): Payer: Self-pay | Admitting: *Deleted

## 2018-08-26 DIAGNOSIS — R350 Frequency of micturition: Secondary | ICD-10-CM | POA: Diagnosis not present

## 2018-08-26 DIAGNOSIS — Z7722 Contact with and (suspected) exposure to environmental tobacco smoke (acute) (chronic): Secondary | ICD-10-CM | POA: Diagnosis not present

## 2018-08-26 DIAGNOSIS — J45909 Unspecified asthma, uncomplicated: Secondary | ICD-10-CM | POA: Diagnosis not present

## 2018-08-26 DIAGNOSIS — F1210 Cannabis abuse, uncomplicated: Secondary | ICD-10-CM | POA: Insufficient documentation

## 2018-08-26 DIAGNOSIS — R300 Dysuria: Secondary | ICD-10-CM

## 2018-08-26 DIAGNOSIS — N509 Disorder of male genital organs, unspecified: Secondary | ICD-10-CM | POA: Diagnosis not present

## 2018-08-26 LAB — URINALYSIS, ROUTINE W REFLEX MICROSCOPIC
Bilirubin Urine: NEGATIVE
Glucose, UA: NEGATIVE mg/dL
Hgb urine dipstick: NEGATIVE
Ketones, ur: NEGATIVE mg/dL
Leukocytes,Ua: NEGATIVE
Nitrite: NEGATIVE
Protein, ur: NEGATIVE mg/dL
Specific Gravity, Urine: 1.02 (ref 1.005–1.030)
pH: 6.5 (ref 5.0–8.0)

## 2018-08-26 NOTE — Discharge Instructions (Addendum)
Please abstain from sexual activity until you follow-up with your STD check.  Urinalysis negative for infection today.  Hydrate as discussed.

## 2018-08-26 NOTE — ED Triage Notes (Signed)
Back pain. He thinks he has a UTI.

## 2018-08-26 NOTE — ED Provider Notes (Addendum)
[facility] EMERGENCY DEPARTMENT Provider Note   CSN: [id number] Arrival date & time: 08/26/18  1112    History   Chief Complaint Chief Complaint  Patient presents with  . Dysuria    HPI Kevin Bryan is a 23 y.o. male.     The history is provided by the patient.  Dysuria Presenting symptoms: dysuria and penile pain (3 lesions to penis for the past several days)   Context: spontaneously   Relieved by:  Nothing Worsened by:  Nothing Associated symptoms: genital lesions and urinary frequency   Associated symptoms: no abdominal pain, no fever, no hematuria, no scrotal swelling, no urinary hesitation, no urinary incontinence, no urinary retention and no vomiting   Risk factors: does not have multiple sexual partners and no STI exposure     Past Medical History:  Diagnosis Date  . Asthma   . Hypospadias   . Nausea and vomiting   . Varicella     Patient Active Problem List   Diagnosis Date Noted  . 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 Family disruption 11/05/2012  . H/O chlamydia infection 10/09/2012  . GERD (gastroesophageal reflux disease) 10/04/2012  . Headache(784.0) 01/11/2012    Past Surgical History:  Procedure Laterality Date  . ADENOIDECTOMY    . ESOPHAGOGASTRODUODENOSCOPY  03/01/2012   Procedure: ESOPHAGOGASTRODUODENOSCOPY (EGD);  Surgeon: Oletha Blend, MD;  Location: [facility];  Service: Gastroenterology;  Laterality: N/A;  . TONSILLECTOMY          Home Medications    Prior to Admission medications   Medication Sig Start Date End Date Taking? Authorizing Provider  dicyclomine (BENTYL) 20 MG tablet Take 1 tablet (20 mg total) by mouth 2 (two) times daily. 07/15/18   Jacqlyn Larsen, PA-C  ondansetron (ZOFRAN ODT) 4 MG disintegrating tablet 4mg  ODT q4 hours prn nausea/vomit 07/15/18   Jacqlyn Larsen, PA-C    Family History Family History  Problem Relation Age of Onset  . Learning disabilities Sister   . Cholelithiasis Maternal Aunt   .  Asthma Mother   . Depression Mother   . Miscarriages / [place] Mother   . Asthma Maternal Grandmother   . Cancer Maternal Grandmother   . Heart disease Maternal Grandmother   . Diabetes Maternal Grandmother   . Alcohol abuse Maternal Grandfather   . Cancer Maternal Grandfather   . Arthritis Maternal Grandfather   . Heart disease Maternal Grandfather   . Diabetes Maternal Grandfather   . Hyperlipidemia Maternal Grandfather   . Hypertension Maternal Grandfather     Social History Social History   Tobacco Use  . Smoking status: Passive Smoke Exposure - Never Smoker  . Smokeless tobacco: Never Used  . Tobacco comment: mom smokes   Substance Use Topics  . Alcohol use: Yes    Alcohol/week: 0.0 standard drinks  . Drug use: Yes    Types: Marijuana    Comment: Tried marijuana once age 6     Allergies   Bee venom and Food   Review of Systems Review of Systems  Constitutional: Negative for chills and fever.  HENT: Negative for ear pain and sore throat.   Eyes: Negative for pain and visual disturbance.  Respiratory: Negative for cough and shortness of breath.   Cardiovascular: Negative for chest pain and palpitations.  Gastrointestinal: Negative for abdominal pain and vomiting.  Genitourinary: Positive for dysuria, frequency and penile pain (3 lesions to penis for the past several days). Negative for bladder incontinence, hematuria, hesitancy and scrotal  swelling.  Musculoskeletal: Negative for arthralgias and back pain.  Skin: Negative for color change and rash.  Neurological: Negative for seizures and syncope.  All other systems reviewed and are negative.    Physical Exam Updated Vital Signs BP 129/72   Pulse 82   Temp 98 F (36.7 C) (Oral)   Resp 20   Ht 5\' 6"  (1.676 m)   Wt 72.2 kg   SpO2 100%   BMI 25.70 kg/m   Physical Exam Vitals signs and nursing note reviewed.  Constitutional:      General: He is not in acute distress.    Appearance: He is  well-developed. He is not ill-appearing.  HENT:     Head: Normocephalic and atraumatic.     Nose: Nose normal.     Mouth/Throat:     Mouth: Mucous membranes are moist.  Eyes:     Extraocular Movements: Extraocular movements intact.     Conjunctiva/sclera: Conjunctivae normal.     Pupils: Pupils are equal, round, and reactive to light.  Neck:     Musculoskeletal: Neck supple.  Cardiovascular:     Rate and Rhythm: Normal rate and regular rhythm.     Pulses: Normal pulses.     Heart sounds: Normal heart sounds. No murmur.  Pulmonary:     Effort: Pulmonary effort is normal. No respiratory distress.     Breath sounds: Normal breath sounds.  Abdominal:     General: There is no distension.     Palpations: Abdomen is soft.     Tenderness: There is no abdominal tenderness.  Genitourinary:    Scrotum/Testes: Normal.     Comments: 3 herpetic lesions to shaft of penis that are crusted over Skin:    General: Skin is warm and dry.  Neurological:     Mental Status: He is alert.      ED Treatments / Results  Labs (all labs ordered are listed, but only abnormal results are displayed) Labs Reviewed  URINALYSIS, ROUTINE W REFLEX MICROSCOPIC  GC/CHLAMYDIA PROBE AMP ([place]) [facility]    EKG None  Radiology No results found.  Procedures Procedures (including critical care time)  Medications Ordered in ED Medications - No data to display   Initial Impression / Assessment and Plan / ED Course  I have reviewed the triage vital signs and the nursing notes.  Pertinent labs & imaging results that were available during my care of the patient were reviewed by me and considered in my medical decision making (see chart for details).     Donzetta Starchustin V Vining is a 23 year old male who presents to the ED with concern for UTI.  Patient with normal vitals.  No fever.  Patient has had pain with urination for the last several days.  Has 3 herpetic lesions to his penis.  Denies any concern  for STD.  No history of herpes.  Patient with negative urinalysis.  Does not want empiric treatment for STD.  Will check with gonorrhea and Chlamydia testing by urine.  Has only one sex partner.  They do not have any symptoms.  Patient appears to have herpes type lesion to his penis or possible ingrown hairs.  These lesions already appear crusted over.  Does not appear to be syphilis.  Does not want any treatment for herpes at this time or other STDs.  Recommend abstain from sex until he finds out his STD results.  Given return precautions and discharged from ED in good condition.  This chart was  dictated using voice recognition software.  Despite best efforts to proofread,  errors can occur which can change the documentation meaning.    Final Clinical Impressions(s) / ED Diagnoses   Final diagnoses:  Dysuria    ED Discharge Orders    None       Virgina NorfolkCuratolo, Bertina Guthridge, DO 08/26/18 1221    Virgina Norfolkuratolo, Elektra Wartman, DO 08/26/18 1222

## 2018-08-27 ENCOUNTER — Ambulatory Visit (INDEPENDENT_AMBULATORY_CARE_PROVIDER_SITE_OTHER): Payer: Managed Care, Other (non HMO) | Admitting: Family Medicine

## 2018-08-27 ENCOUNTER — Encounter: Payer: Self-pay | Admitting: Family Medicine

## 2018-08-27 DIAGNOSIS — R300 Dysuria: Secondary | ICD-10-CM | POA: Diagnosis not present

## 2018-08-27 MED ORDER — PHENAZOPYRIDINE HCL 100 MG PO TABS: 100.0000 mg | ORAL_TABLET | Freq: Three times a day (TID) | ORAL | 0 refills | Status: DC | PRN
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
## 2018-08-28 ENCOUNTER — Ambulatory Visit: Payer: Managed Care, Other (non HMO) | Admitting: Internal Medicine

## 2018-08-28 ENCOUNTER — Other Ambulatory Visit (HOSPITAL_COMMUNITY)
Admission: RE | Admit: 2018-08-28 | Discharge: 2018-08-28 | Disposition: A | Discharge status: Home / Self Care | Payer: Managed Care, Other (non HMO) | Source: Ambulatory Visit | Attending: Internal Medicine | Admitting: Internal Medicine

## 2018-08-28 ENCOUNTER — Other Ambulatory Visit: Payer: Self-pay

## 2018-08-28 ENCOUNTER — Encounter: Payer: Self-pay | Admitting: Internal Medicine

## 2018-08-28 VITALS — BP 128/75 | HR 91 | Temp 98.0°F | Resp 16 | Ht 66.0 in | Wt 160.0 lb

## 2018-08-28 DIAGNOSIS — A6000 Herpesviral infection of urogenital system, unspecified: Secondary | ICD-10-CM | POA: Diagnosis not present

## 2018-08-28 DIAGNOSIS — A6001 Herpesviral infection of penis: Secondary | ICD-10-CM | POA: Diagnosis not present

## 2018-08-28 DIAGNOSIS — R399 Unspecified symptoms and signs involving the genitourinary system: Secondary | ICD-10-CM

## 2018-08-28 LAB — POC URINALSYSI DIPSTICK (AUTOMATED)
Bilirubin, UA: NEGATIVE
Blood, UA: NEGATIVE
Glucose, UA: NEGATIVE
Ketones, UA: NEGATIVE
Leukocytes, UA: NEGATIVE
Nitrite, UA: NEGATIVE
Protein, UA: NEGATIVE
Spec Grav, UA: 1.015 (ref 1.010–1.025)
Urobilinogen, UA: 0.2 E.U./dL
pH, UA: 7 (ref 5.0–8.0)

## 2018-08-28 LAB — URINALYSIS, ROUTINE W REFLEX MICROSCOPIC
Bilirubin Urine: NEGATIVE
Hgb urine dipstick: NEGATIVE
Ketones, ur: NEGATIVE
Leukocytes,Ua: NEGATIVE
Nitrite: NEGATIVE
RBC / HPF: NONE SEEN (ref 0–?)
Specific Gravity, Urine: <=1.005 — AB (ref 1.000–1.030)
Total Protein, Urine: NEGATIVE
Urine Glucose: NEGATIVE
Urobilinogen, UA: 0.2 (ref 0.0–1.0)
WBC, UA: NONE SEEN (ref 0–?)
pH: 7 (ref 5.0–8.0)

## 2018-08-28 MED ORDER — VALACYCLOVIR HCL 1 G PO TABS: 1000.0000 mg | ORAL_TABLET | Freq: Three times a day (TID) | ORAL | 0 refills | Status: DC

## 2018-08-28 NOTE — Patient Instructions (Addendum)
Get the blood work     GO TO THE FRONT DESK Schedule your next appointment   schedule a follow-up with Dr. Carmelia RollerWendling in 3 weeks  Take Valtrex as prescribed  Take Pyridium to help with the pain, will turn your urine orange in color   Genital Herpes Genital herpes is a common sexually transmitted infection (STI) that is caused by a virus. The virus spreads from person to person through sexual contact. Infection can cause itching, blisters, and sores around the genitals or rectum. Symptoms may last several days and then go away This is called an outbreak. However, the virus remains in your body, so you may have more outbreaks in the future. The time between outbreaks varies and can be months or years. Genital herpes affects men and women. It is particularly concerning for pregnant women because the virus can be passed to the baby during delivery and can cause serious problems. Genital herpes is also a concern for people who have a weak disease-fighting (immune) system. What are the causes? This condition is caused by the herpes simplex virus (HSV) type 1 or type 2. The virus may spread through:  Sexual contact with an infected person, including vaginal, anal, and oral sex.  Contact with fluid from a herpes sore.  The skin. This means that you can get herpes from an infected partner even if he or she does not have a visible sore or does not know that he or she is infected. What increases the risk? You are more likely to develop this condition if:  You have sex with many partners.  You do not use latex condoms during sex. What are the signs or symptoms? Most people do not have symptoms (asymptomatic) or have mild symptoms that may be mistaken for other skin problems. Symptoms may include:  Small red bumps near the genitals, rectum, or mouth. These bumps turn into blisters and then turn into sores.  Flu-like symptoms, including: ? Fever. ? Body aches. ? Swollen lymph  nodes. ? Headache.  Painful urination.  Pain and itching in the genital area or rectal area.  Vaginal discharge.  Tingling or shooting pain in the legs and buttocks. Generally, symptoms are more severe and last longer during the first (primary) outbreak. Flu-like symptoms are also more common during the primary outbreak. How is this diagnosed? Genital herpes may be diagnosed based on:  A physical exam.  Your medical history.  Blood tests.  A test of a fluid sample (culture) from an open sore. How is this treated? There is no cure for this condition, but treatment with antiviral medicines that are taken by mouth (orally) can do the following:  Speed up healing and relieve symptoms.  Help to reduce the spread of the virus to sexual partners.  Limit the chance of future outbreaks, or make future outbreaks shorter.  Lessen symptoms of future outbreaks. Your health care provider may also recommend pain relief medicines, such as aspirin or ibuprofen. Follow these instructions at home: Sexual activity  Do not have sexual contact during active outbreaks.  Practice safe sex. Latex condoms and male condoms may help prevent the spread of the herpes virus. General instructions  Keep the affected areas dry and clean.  Take over-the-counter and prescription medicines only as told by your health care provider.  Avoid rubbing or touching blisters and sores. If you do touch blisters or sores: ? Wash your hands thoroughly with soap and water. ? Do not touch your eyes afterward.  To  help relieve pain or itching, you may take the following actions as directed by your health care provider: ? Apply a cold, wet cloth (cold compress) to affected areas 4-6 times a day. ? Apply a substance that protects your skin and reduces bleeding (astringent). ? Apply a gel that helps relieve pain around sores (lidocaine gel). ? Take a warm, shallow bath that cleans the genital area (sitz  bath).  Keep all follow-up visits as told by your health care provider. This is important. How is this prevented?  Use condoms. Although anyone can get genital herpes during sexual contact, even with the use of a condom, a condom can provide some protection.  Avoid having multiple sexual partners.  Talk with your sexual partner about any symptoms either of you may have. Also, talk with your partner about any history of STIs.  Get tested for STIs before you have sex. Ask your partner to do the same.  Do not have sexual contact if you have symptoms of genital herpes. Contact a health care provider if:  Your symptoms are not improving with medicine.  Your symptoms return.  You have new symptoms.  You have a fever.  You have abdominal pain.  You have redness, swelling, or pain in your eye.  You notice new sores on other parts of your body.  You are a woman and experience bleeding between menstrual periods.  You have had herpes and you become pregnant or plan to become pregnant. Summary  Genital herpes is a common sexually transmitted infection (STI) that is caused by the herpes simplex virus (HSV) type 1 or type 2.  These viruses are most often spread through sexual contact with an infected person.  You are more likely to develop this condition if you have sex with many partners or you have unprotected sex.  Most people do not have symptoms (asymptomatic) or have mild symptoms that may be mistaken for other skin problems. Symptoms occur as outbreaks that may happen months or years apart.  There is no cure for this condition, but treatment with oral antiviral medicines can reduce symptoms, reduce the chance of spreading the virus to a partner, prevent future outbreaks, or shorten future outbreaks. This information is not intended to replace advice given to you by your health care provider. Make sure you discuss any questions you have with your health care provider. Document  Released: 02/25/2000 Document Revised: 01/28/2016 Document Reviewed: 01/28/2016 Elsevier Interactive Patient Education  2019 [employer].

## 2018-08-28 NOTE — Progress Notes (Signed)
Pre visit review using our clinic review tool, if applicable. No additional management support is needed unless otherwise documented below in the visit note. 

## 2018-08-28 NOTE — Progress Notes (Signed)
Subjective:    Patient ID: Kevin Bryan, male    DOB: 04/15/1995, 23 y.o.   MRN: [id number]  DOS:  08/28/2018 Type of visit - description: acute  seen at the ER 08/26/2018 with dysuria and penile discomfort.  At the ER, the physician found few herpetic lesions at the penis that were already crusted over. Subsequently was seen virtually by Dr. Nani Ravens yesterday he was recommended to come in person.  Today, the patient confirm the h/o blisters; dysuria is actually worse today, symptoms are only when he urinates.  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  History of STD about 5 years ago, has been asymptomatic since.  The patient is married, reports no high risk behaviors.   Review of Systems No fever chills No penile discharge.  No testicular pain, no gross hematuria. No flank or abdominal pain  Past Medical History:  Diagnosis Date  . Asthma   . Hypospadias   . Nausea and vomiting   . Varicella     Past Surgical History:  Procedure Laterality Date  . ADENOIDECTOMY    . ESOPHAGOGASTRODUODENOSCOPY  03/01/2012   Procedure: ESOPHAGOGASTRODUODENOSCOPY (EGD);  Surgeon: Oletha Blend, MD;  Location: [place] [place];  Service: Gastroenterology;  Laterality: N/A;  . TONSILLECTOMY      Social History   Socioeconomic History  . Marital status: Married    Spouse name: Not on file  . Number of children: Not on file  . Years of education: Not on file  . Highest education level: Not on file  Occupational History  . Not on file  Social Needs  . Financial resource strain: Not on file  . Food insecurity    Worry: Not on file    Inability: Not on file  . Transportation needs    Medical: Not on file    Non-medical: Not on file  Tobacco Use  . Smoking status: Passive Smoke Exposure - Never Smoker  . Smokeless tobacco: Never Used  . Tobacco comment: mom smokes   Substance and Sexual Activity  . Alcohol use: Yes    Alcohol/week:  0.0 standard drinks  . Drug use: Yes    Types: Marijuana    Comment: Tried marijuana once age 52  . Sexual activity: Not on file    Comment: uses condoms  Lifestyle  . Physical activity    Days per week: Not on file    Minutes per session: Not on file  . Stress: Not on file  Relationships  . Social Herbalist on phone: Not on file    Gets together: Not on file    Attends religious service: Not on file    Active member of club or organization: Not on file    Attends meetings of clubs or organizations: Not on file    Relationship status: Not on file  . Intimate partner violence    Fear of current or ex partner: Not on file    Emotionally abused: Not on file    Physically abused: Not on file    Forced sexual activity: Not on file  Other Topics Concern  . Not on file  Social History Narrative   Lives at home with mom, mom's boyfriend, and grandfather. Sister lives with boyfriend. Graduated from high school.      Allergies as of 08/28/2018      Reactions   Bee Venom Swelling   Food Itching   Maple Syrup-mouth  itches       Medication List       Accurate as of August 28, 2018 11:53 AM. If you have any questions, ask your nurse or doctor.        dicyclomine 20 MG tablet Commonly known as: BENTYL Take 1 tablet (20 mg total) by mouth 2 (two) times daily.   ondansetron 4 MG disintegrating tablet Commonly known as: Zofran ODT 4mg  ODT q4 hours prn nausea/vomit   phenazopyridine 100 MG tablet Commonly known as: PYRIDIUM Take 1 tablet (100 mg total) by mouth 3 (three) times daily as needed for pain.        Objective:   Physical Exam BP 128/75 (BP Location: Right Arm, Patient Position: Sitting, Cuff Size: Small)   Pulse 91   Temp 98 F (36.7 C) (Oral)   Resp 16   Ht 5\' 6"  (1.676 m)   Wt 160 lb (72.6 kg)   SpO2 100%   BMI 25.82 kg/m  General:   Well developed, NAD, BMI noted.  HEENT:  Normocephalic . Face symmetric, atraumatic Abdomen:  Not  distended, soft, non-tender. No rebound or rigidity.  No CVA tenderness GU: Testicles symmetric, nontender.  No nodules. Groins: No lymphadenopathies Penis: Has 3-4 dried  up ulcers at the left side of the ventral area near the glans. The visible portion of the urethra is swollen, more on the left.  No discharge. DRE: No stools found, normal sphincter tone, prostate normal in size Skin: Not pale. Not jaundice Neurologic:  alert & oriented X3.  Speech normal, gait appropriate for age and unassisted Psych--  Cognition and judgment appear intact.  Cooperative with normal attention span and concentration.  Behavior appropriate. No anxious or depressed appearing.     Assessment    23 year old male, presents with:  Herpes genitalis, first episode: Advised patient that this is a clinical diagnosis, explained to him what herpes is, this may come back, he is very contagious as long as he has lesions. We will check HIV, RPR. Urinalysis at the ER was negative but will repeat a UA, urine culture and get G&C which has not been done so far. We will prescribe Valtrex understanding that it may be late for it to help but he is quite symptomatic. Dysuria: Severe, likely related to herpes, checking a G&C, treat accordingly  Today, I spent more than 25 min with the patient: >50% of the time counseling regards the first episode of genital herpes, explaining him what it is, how it is treated, potential for return.  Also reviewing the chart

## 2018-08-29 DIAGNOSIS — A6000 Herpesviral infection of urogenital system, unspecified: Secondary | ICD-10-CM | POA: Insufficient documentation

## 2018-08-29 LAB — URINE CYTOLOGY ANCILLARY ONLY
Chlamydia: NEGATIVE
Neisseria Gonorrhea: NEGATIVE

## 2018-08-30 ENCOUNTER — Encounter: Payer: Managed Care, Other (non HMO) | Admitting: Family Medicine

## 2018-08-30 LAB — URINE CULTURE
MICRO NUMBER:: 579310
Result:: NO GROWTH
SPECIMEN QUALITY:: ADEQUATE

## 2018-08-30 LAB — RPR: RPR Ser Ql: NONREACTIVE

## 2018-08-30 LAB — HIV ANTIBODY (ROUTINE TESTING W REFLEX): HIV 1&2 Ab, 4th Generation: NONREACTIVE

## 2018-09-16 ENCOUNTER — Other Ambulatory Visit: Payer: Self-pay

## 2018-09-16 ENCOUNTER — Encounter: Payer: Self-pay | Admitting: Family Medicine

## 2018-09-16 ENCOUNTER — Ambulatory Visit (INDEPENDENT_AMBULATORY_CARE_PROVIDER_SITE_OTHER): Payer: Managed Care, Other (non HMO) | Admitting: Family Medicine

## 2018-09-16 VITALS — BP 132/80 | HR 75 | Temp 97.8°F | Ht 66.0 in | Wt 161.0 lb

## 2018-09-16 DIAGNOSIS — Z0000 Encounter for general adult medical examination without abnormal findings: Secondary | ICD-10-CM | POA: Diagnosis not present

## 2018-09-16 NOTE — Patient Instructions (Addendum)
Give [place] 2-3 business days to get the results of your labs back.   Keep the diet clean and stay active.  Do monthly self testicular checks in the shower. You are feeling for lumps/bumps that don't belong. If you feel anything like this, let me know!  Clotrimazole twice daily for 14 days treats jock itch. Baby powder helps with prevention.  Let us know if you need anything.

## 2018-09-16 NOTE — Progress Notes (Signed)
Chief Complaint  Patient presents with  . Annual Exam    Well Male Kevin Bryan is here for a complete physical.   His last physical was >1 year ago.  Current diet: in general, diet could be better.   Current exercise: active at work, play with kids, chasing goats Weight trend: stable Daytime fatigue? No. Seat belt? Yes.    Health maintenance Tetanus- Yes HIV- Yes  Past Medical History:  Diagnosis Date  . Asthma   . Hypospadias   . Nausea and vomiting   . Varicella     Past Surgical History:  Procedure Laterality Date  . ADENOIDECTOMY    . ESOPHAGOGASTRODUODENOSCOPY  03/01/2012   Procedure: ESOPHAGOGASTRODUODENOSCOPY (EGD);  Surgeon: Oletha Blend, MD;  Location: [facility];  Service: Gastroenterology;  Laterality: N/A;  . TONSILLECTOMY     Medications  Takes no meds routinely.   Allergies Allergies  Allergen Reactions  . Bee Venom Swelling  . Food Itching    Maple Syrup-mouth itches    Family History Family History  Problem Relation Age of Onset  . Learning disabilities Sister   . Cholelithiasis Maternal Aunt   . Asthma Mother   . Depression Mother   . Miscarriages / [place] Mother   . Asthma Maternal Grandmother   . Cancer Maternal Grandmother   . Heart disease Maternal Grandmother   . Diabetes Maternal Grandmother   . Alcohol abuse Maternal Grandfather   . Cancer Maternal Grandfather   . Arthritis Maternal Grandfather   . Heart disease Maternal Grandfather   . Diabetes Maternal Grandfather   . Hyperlipidemia Maternal Grandfather   . Hypertension Maternal Grandfather    Review of Systems: Constitutional: no fevers or chills Eye:  no recent significant change in vision Ear/Nose/Mouth/Throat:  Ears:  no tinnitus or hearing loss Nose/Mouth/Throat:  no complaints of nasal congestion, no sore throat Cardiovascular:  no chest pain, no palpitations Respiratory:  no cough and no shortness of breath Gastrointestinal:  no abdominal pain, no change in  bowel habits GU:  Male: negative for dysuria, frequency, and incontinence and negative for prostate symptoms Musculoskeletal/Extremities:  no pain, redness, or swelling of the joints Integumentary (Skin/Breast):  no abnormal skin lesions reported Neurologic:  no headaches, no numbness, tingling Endocrine: No unexpected weight changes Hematologic/Lymphatic:  no night sweats  Exam BP 132/80 (BP Location: Left Arm, Patient Position: Sitting, Cuff Size: Normal)   Pulse 75   Temp 97.8 F (36.6 C) (Oral)   Ht 5\' 6"  (1.676 m)   Wt 161 lb (73 kg)   SpO2 99%   BMI 25.99 kg/m  General:  well developed, well nourished, in no apparent distress Skin:  no significant moles, warts, or growths Head:  no masses, lesions, or tenderness Eyes:  pupils equal and round, sclera anicteric without injection Ears:  canals without lesions, TMs shiny without retraction, no obvious effusion, no erythema Nose:  nares patent, septum midline, mucosa normal Throat/Pharynx:  lips and gingiva without lesion; tongue and uvula midline; non-inflamed pharynx; no exudates or postnasal drainage Neck: neck supple without adenopathy, thyromegaly, or masses Lungs:  clear to auscultation, breath sounds equal bilaterally, no respiratory distress Cardio:  regular rate and rhythm, no bruits, no LE edema Abdomen:  abdomen soft, nontender; bowel sounds normal; no masses or organomegaly Genital (male): circumcised penis, no lesions or discharge; testes present bilaterally without masses or tenderness Rectal: Deferred Musculoskeletal:  symmetrical muscle groups noted without atrophy or deformity Extremities:  no clubbing, cyanosis, or edema, no  deformities, no skin discoloration Neuro:  gait normal; deep tendon reflexes normal and symmetric Psych: well oriented with normal range of affect and appropriate judgment/insight  Assessment and Plan  Well adult exam - Plan: Comprehensive metabolic panel, Lipid panel  Well 23 y.o.  male. Counseled on diet and exercise. Other orders as above. Brush teeth daily. Self testicular checks q mo.  Mentioned vesicles in groin, would be interested in getting officially checked in future for herpes with culture. He will let us know when he has them again and we will do a culture.  Follow up in 1 year pending the above workup. Come back at earliest convenience for fasting labs.  The patient voiced understanding and agreement to the plan.  [name], DO 09/16/18 3:12 PM

## 2018-09-19 ENCOUNTER — Other Ambulatory Visit (INDEPENDENT_AMBULATORY_CARE_PROVIDER_SITE_OTHER): Payer: Managed Care, Other (non HMO)

## 2018-09-19 ENCOUNTER — Other Ambulatory Visit: Payer: Self-pay

## 2018-09-19 DIAGNOSIS — Z0000 Encounter for general adult medical examination without abnormal findings: Secondary | ICD-10-CM

## 2018-09-19 LAB — COMPREHENSIVE METABOLIC PANEL
ALT: 16 U/L (ref 0–53)
AST: 17 U/L (ref 0–37)
Albumin: 4.8 g/dL (ref 3.5–5.2)
Alkaline Phosphatase: 86 U/L (ref 39–117)
BUN: 12 mg/dL (ref 6–23)
CO2: 29 mEq/L (ref 19–32)
Calcium: 9.4 mg/dL (ref 8.4–10.5)
Chloride: 102 mEq/L (ref 96–112)
Creatinine, Ser: 1.07 mg/dL (ref 0.40–1.50)
GFR: 86.62 mL/min (ref >60.00)
Glucose, Bld: 106 mg/dL — ABNORMAL HIGH (ref 70–99)
Potassium: 4 mEq/L (ref 3.5–5.1)
Sodium: 139 mEq/L (ref 135–145)
Total Bilirubin: 0.5 mg/dL (ref 0.2–1.2)
Total Protein: 7.2 g/dL (ref 6.0–8.3)

## 2018-09-19 LAB — LIPID PANEL
Cholesterol: 153 mg/dL (ref 0–200)
HDL: 36.9 mg/dL — ABNORMAL LOW (ref >39.00)
LDL Cholesterol: 79 mg/dL (ref 0–99)
NonHDL: 116.27
Total CHOL/HDL Ratio: 4
Triglycerides: 185 mg/dL — ABNORMAL HIGH (ref 0.0–149.0)
VLDL: 37 mg/dL (ref 0.0–40.0)

## 2018-11-20 ENCOUNTER — Ambulatory Visit (INDEPENDENT_AMBULATORY_CARE_PROVIDER_SITE_OTHER): Payer: Managed Care, Other (non HMO) | Admitting: Family Medicine

## 2018-11-20 ENCOUNTER — Other Ambulatory Visit: Payer: Self-pay

## 2018-11-20 ENCOUNTER — Encounter: Payer: Self-pay | Admitting: Family Medicine

## 2018-11-20 DIAGNOSIS — J3089 Other allergic rhinitis: Secondary | ICD-10-CM

## 2018-11-20 MED ORDER — FLUTICASONE PROPIONATE 50 MCG/ACT NA SUSP: 2.0000 | Freq: Every day | NASAL | 6 refills | Status: DC

## 2018-11-20 MED ORDER — LEVOCETIRIZINE DIHYDROCHLORIDE 5 MG PO TABS: 5.0000 mg | ORAL_TABLET | Freq: Every evening | ORAL | 5 refills | Status: DC

## 2018-11-20 MED ORDER — ALBUTEROL SULFATE HFA 108 (90 BASE) MCG/ACT IN AERS: 2.0000 | INHALATION_SPRAY | Freq: Four times a day (QID) | RESPIRATORY_TRACT | 2 refills | Status: AC | PRN

## 2018-11-20 NOTE — Progress Notes (Signed)
I have discussed the procedure for the virtual visit with the patient who has given consent to proceed with assessment and treatment.   Pt feels that his allergies are causing the issues. Was told he was allergic to maple syrup [place] thinks this is causing some issues.   Pt unable to obtain vitals.   Davis Gourd, CMA

## 2018-11-20 NOTE — Progress Notes (Signed)
Chief Complaint  Patient presents with  . congestion    chest and nasal, mostly mornigns and late night  . Sore Throat    pot nasal drainage  . Headache    Geryl Rankins here for allergy complaints. Due to COVID-19 pandemic, we are interacting via web portal for an electronic face-to-face visit. I verified patient's ID using 2 identifiers. Patient agreed to proceed with visit via this method. Patient is at work, I am at office. Patient and I are present for visit.   Duration: 10 days  Associated symptoms: sinus congestion, itchy watery eyes, wheezing and shortness of breath Denies: sinus pain, rhinorrhea, ear pain, ear drainage, sore throat, myalgia and fevers Treatment to date: Benadryl, Sudafed Sick contacts: No  ROS:  Const: Denies fevers HEENT: As noted in HPI Lungs: +wheezing  Past Medical History:  Diagnosis Date  . Asthma   . Hypospadias   . Nausea and vomiting   . Varicella    Exam No conversational dyspnea Age appropriate judgment and insight Nml affect and mood  Seasonal allergic rhinitis due to other allergic trigger - Plan: fluticasone (FLONASE) 50 MCG/ACT nasal spray, levocetirizine (XYZAL) 5 MG tablet, albuterol (VENTOLIN HFA) 108 (90 Base) MCG/ACT inhaler  OTC options send via MyChart. F/u prn.  Pt voiced understanding and agreement to the plan.  [place], DO 11/20/18 10:44 AM

## 2018-12-06 ENCOUNTER — Encounter: Payer: Self-pay | Admitting: Family Medicine

## 2018-12-06 ENCOUNTER — Ambulatory Visit (INDEPENDENT_AMBULATORY_CARE_PROVIDER_SITE_OTHER): Payer: Managed Care, Other (non HMO) | Admitting: Family Medicine

## 2018-12-06 ENCOUNTER — Other Ambulatory Visit: Payer: Self-pay

## 2018-12-06 DIAGNOSIS — A084 Viral intestinal infection, unspecified: Secondary | ICD-10-CM | POA: Insufficient documentation

## 2018-12-06 DIAGNOSIS — R112 Nausea with vomiting, unspecified: Secondary | ICD-10-CM

## 2018-12-06 MED ORDER — ONDANSETRON 4 MG PO TBDP: 4.0000 mg | ORAL_TABLET | Freq: Three times a day (TID) | ORAL | 0 refills | Status: DC | PRN

## 2018-12-06 NOTE — Progress Notes (Signed)
Virtual Visit via Video Note  I connected with Geryl Rankins on 12/06/18 at 11:20 AM EDT by a video enabled telemedicine application and verified that I am speaking with the correct person using two identifiers.  Location: Patient: in car Provider: office    I discussed the limitations of evaluation and management by telemedicine and the availability of in person appointments. The patient expressed understanding and agreed to proceed.  History of Present Illness: Pt is in his car driving.  He did pull over to speak to me.  He was tested for covid yesterday at [facility] after being sent home from work for vomiting and diarrhea.  He has had no fever.  His test was neg He has been out of work since yesterday No other complaints.     Observations/Objective: No vitals obtained No fever Pt is in nad  Assessment and Plan: 1. Nausea and vomiting, intractability of vomiting not specified, unspecified vomiting type zofran refilled If this does not help pt was instructed to go to Er for IVf  - ondansetron (ZOFRAN ODT) 4 MG disintegrating tablet; Take 1 tablet (4 mg total) by mouth every 8 (eight) hours as needed for nausea or vomiting.  Dispense: 15 tablet; Refill: 0  Follow Up Instructions:    I discussed the assessment and treatment plan with the patient. The patient was provided an opportunity to ask questions and all were answered. The patient agreed with the plan and demonstrated an understanding of the instructions.   The patient was advised to call back or seek an in-person evaluation if the symptoms worsen or if the condition fails to improve as anticipated.  I provided 15 minutes of non-face-to-face time during this encounter.   Ann Held, DO

## 2018-12-06 NOTE — Assessment & Plan Note (Addendum)
covid was neg at [facility]  Clear liquids pedialyte ice pops zofran prn n/v If no relief with zofran-- go to ER

## 2019-02-26 ENCOUNTER — Telehealth: Payer: Self-pay | Admitting: *Deleted

## 2019-02-26 NOTE — Telephone Encounter (Signed)
Called left message to call back 

## 2019-02-26 NOTE — Telephone Encounter (Signed)
Copied from [place] [id number]. Topic: Appointment Scheduling - Prior Auth Required for Appointment >> Feb 26, 2019  8:50 AM Robina Ade, Helene Kelp D wrote: No appointment has been scheduled. Patient is requesting same day appointment virtual. Per scheduling protocol, this appointment requires a prior authorization prior to scheduling.  Route to department's PEC pool.

## 2019-02-26 NOTE — Telephone Encounter (Signed)
Called both numbers left detailed message to call back to schedule appointment

## 2019-02-28 NOTE — Telephone Encounter (Signed)
Called again and unable to speak to the patient. Left message

## 2019-07-14 ENCOUNTER — Other Ambulatory Visit: Payer: Self-pay

## 2019-07-14 ENCOUNTER — Encounter: Payer: Self-pay | Admitting: Family Medicine

## 2019-07-14 ENCOUNTER — Ambulatory Visit: Payer: Managed Care, Other (non HMO) | Admitting: Family Medicine

## 2019-07-14 VITALS — BP 110/70 | HR 90 | Temp 97.0°F | Ht 66.0 in | Wt 159.0 lb

## 2019-07-14 DIAGNOSIS — G4700 Insomnia, unspecified: Secondary | ICD-10-CM

## 2019-07-14 DIAGNOSIS — R0681 Apnea, not elsewhere classified: Secondary | ICD-10-CM | POA: Diagnosis not present

## 2019-07-14 MED ORDER — TRAZODONE HCL 50 MG PO TABS: 25.0000 mg | ORAL_TABLET | Freq: Every evening | ORAL | 3 refills | Status: DC | PRN

## 2019-07-14 NOTE — Patient Instructions (Addendum)
Sleep Hygiene Tips:  Do not watch TV or look at screens within 1 hour of going to bed. If you do, make sure there is a blue light filter (nighttime mode) involved.  Try to go to bed around the same time every night. Wake up at the same time within 1 hour of regular time. Ex: If you wake up at 7 AM for work, do not sleep past 8 AM on days that you don't work.  Do not drink alcohol before bedtime.  Do not consume caffeine-containing beverages after noon or within 9 hours of intended bedtime.  Get regular exercise/physical activity in your life, but not within 2 hours of planned bedtime.  Do not take naps.   Do not eat within 2 hours of planned bedtime.  Melatonin, 3-5 mg 30-60 minutes before planned bedtime may be helpful.   The bed should be for sleep or sex only. If after 20-30 minutes you are unable to fall asleep, get up and do something relaxing. Do this until you feel ready to go to sleep again.   Sleep is important to [place] all. Getting good sleep is imperative to adequate functioning during the day. Work with our counselors who are trained to help people obtain quality sleep. Call [phone] to schedule an appointment or if you are curious about insurance coverage/cost.  If you do not hear anything about your referral in the next 1-2 weeks, call our office and ask for an update.  Let us know if you need anything.

## 2019-07-14 NOTE — Progress Notes (Signed)
Chief Complaint  Patient presents with  . Insomnia    Subjective: Patient is a 24 y.o. male here for sleep issue.  2.5-3 mo ago, pt started having difficulty falling asleep. Will also wake up and have difficulty falling asleep again. Feels very tired. 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 Needs to get around 5-7 hrs nightly, has been getting 2-3 hrs nightly lately. Does not nap. No caffeine after noon. Does not consume alcohol regularly. Has been stressful in past week, not at onset of issue. Does not watch TV, use phone or tablet, sleeps in dark room. +famhx of sleep apnea. +hx of anxiety/depression.   Past Medical History:  Diagnosis Date  . Asthma   . Hypospadias   . Nausea and vomiting   . Varicella     Objective: BP 110/70 (BP Location: Right Arm, Patient Position: Sitting, Cuff Size: Normal)   Pulse 90   Temp (!) 97 F (36.1 C) (Temporal)   Ht 5\' 6"  (1.676 m)   Wt 159 lb (72.1 kg)   SpO2 97%   BMI 25.66 kg/m  General: Awake, appears stated age HEENT: MMM, EOMi, Mallampati IV Heart: RRR Lungs: CTAB, no rales, wheezes or rhonchi. No accessory muscle use Psych: Age appropriate judgment and insight, normal affect and mood  Assessment and Plan: Witnessed apneic spells - Plan: Ambulatory referral to Pulmonology  Insomnia, unspecified type - Plan: traZODone (DESYREL) 50 MG tablet  1- OSA eval. 2- Start trazodone. Sleep hygiene info verbalized and written down. Counseling info also provided.  F/u in 6 weeks to check that.  The patient voiced understanding and agreement to the plan.  [place], DO 07/14/19  2:36 PM

## 2019-08-25 ENCOUNTER — Ambulatory Visit: Payer: Managed Care, Other (non HMO) | Admitting: Family Medicine

## 2019-09-17 ENCOUNTER — Encounter: Payer: Managed Care, Other (non HMO) | Admitting: Family Medicine

## 2019-11-07 ENCOUNTER — Encounter (HOSPITAL_BASED_OUTPATIENT_CLINIC_OR_DEPARTMENT_OTHER): Payer: Self-pay | Admitting: Emergency Medicine

## 2019-11-07 ENCOUNTER — Other Ambulatory Visit: Payer: Self-pay

## 2019-11-07 ENCOUNTER — Emergency Department (HOSPITAL_BASED_OUTPATIENT_CLINIC_OR_DEPARTMENT_OTHER)
Admission: EM | Admit: 2019-11-07 | Discharge: 2019-11-07 | Disposition: A | Discharge status: Home / Self Care | Attending: Emergency Medicine | Admitting: Emergency Medicine

## 2019-11-07 DIAGNOSIS — Y929 Unspecified place or not applicable: Secondary | ICD-10-CM | POA: Diagnosis not present

## 2019-11-07 DIAGNOSIS — W208XXA Other cause of strike by thrown, projected or falling object, initial encounter: Secondary | ICD-10-CM | POA: Diagnosis not present

## 2019-11-07 DIAGNOSIS — Y999 Unspecified external cause status: Secondary | ICD-10-CM | POA: Insufficient documentation

## 2019-11-07 DIAGNOSIS — Y939 Activity, unspecified: Secondary | ICD-10-CM | POA: Insufficient documentation

## 2019-11-07 DIAGNOSIS — S64492A Injury of digital nerve of right middle finger, initial encounter: Secondary | ICD-10-CM | POA: Diagnosis not present

## 2019-11-07 DIAGNOSIS — S61212A Laceration without foreign body of right middle finger without damage to nail, initial encounter: Secondary | ICD-10-CM | POA: Diagnosis not present

## 2019-11-07 DIAGNOSIS — Z7951 Long term (current) use of inhaled steroids: Secondary | ICD-10-CM | POA: Diagnosis not present

## 2019-11-07 DIAGNOSIS — Z7722 Contact with and (suspected) exposure to environmental tobacco smoke (acute) (chronic): Secondary | ICD-10-CM | POA: Diagnosis not present

## 2019-11-07 DIAGNOSIS — J45909 Unspecified asthma, uncomplicated: Secondary | ICD-10-CM | POA: Insufficient documentation

## 2019-11-07 DIAGNOSIS — S60942A Unspecified superficial injury of right middle finger, initial encounter: Secondary | ICD-10-CM | POA: Diagnosis present

## 2019-11-07 MED ORDER — CEPHALEXIN 500 MG PO CAPS: 500.0000 mg | ORAL_CAPSULE | Freq: Four times a day (QID) | ORAL | 0 refills | Status: DC

## 2019-11-07 MED ORDER — LIDOCAINE-EPINEPHRINE 2 %-1:100000 IJ SOLN: 20.0000 mL | Freq: Once | INTRAMUSCULAR | Status: DC

## 2019-11-07 MED ORDER — CEPHALEXIN 250 MG PO CAPS
1000.0000 mg | ORAL_CAPSULE | Freq: Once | ORAL | Status: AC
  Administered 2019-11-07: 1000 mg via ORAL
  Filled 2019-11-07: qty 4

## 2019-11-07 MED ORDER — LIDOCAINE-EPINEPHRINE (PF) 2 %-1:200000 IJ SOLN
INTRAMUSCULAR | Status: AC
  Filled 2019-11-07: qty 20

## 2019-11-07 NOTE — ED Provider Notes (Signed)
[facility] Provider Note: Lowella Dell, MD, FACEP  CSN: [id number] MRN: [id number] ARRIVAL: 11/07/19 at 0444 ROOM: MH05/[facility]05   CHIEF COMPLAINT  Laceration   HISTORY OF PRESENT ILLNESS  11/07/19 4:56 AM Kevin Bryan is a 24 y.o. male who had a piece of metal fall onto his right middle finger at work just prior to arrival.  He has a laceration of the middle phalanx.  He rates associated pain is a 6 out of 10, throbbing in nature, worse with movement or palpation.  He has numbness distal to the cut but bleeding is controlled.  His tetanus is up-to-date.   Past Medical History:  Diagnosis Date  . Asthma   . Hypospadias   . Nausea and vomiting   . Varicella     Past Surgical History:  Procedure Laterality Date  . ADENOIDECTOMY    . ESOPHAGOGASTRODUODENOSCOPY  03/01/2012   Procedure: ESOPHAGOGASTRODUODENOSCOPY (EGD);  Surgeon: Jon Gills, MD;  Location: [facility] OR;  Service: Gastroenterology;  Laterality: N/A;  . TONSILLECTOMY      Family History  Problem Relation Age of Onset  . Learning disabilities Sister   . Cholelithiasis Maternal Aunt   . Asthma Mother   . Depression Mother   . Miscarriages / [place] Mother   . Asthma Maternal Grandmother   . Cancer Maternal Grandmother   . Heart disease Maternal Grandmother   . Diabetes Maternal Grandmother   . Alcohol abuse Maternal Grandfather   . Cancer Maternal Grandfather   . Arthritis Maternal Grandfather   . Heart disease Maternal Grandfather   . Diabetes Maternal Grandfather   . Hyperlipidemia Maternal Grandfather   . Hypertension Maternal Grandfather     Social History   Tobacco Use  . Smoking status: Passive Smoke Exposure - Never Smoker  . Smokeless tobacco: Never Used  . Tobacco comment: mom smokes   Substance Use Topics  . Alcohol use: Yes    Alcohol/week: 0.0 standard drinks  . Drug use: Yes    Types: Marijuana    Comment: Tried marijuana once age 57    Prior to Admission  medications   Medication Sig Start Date End Date Taking? Authorizing Provider  albuterol (VENTOLIN HFA) 108 (90 Base) MCG/ACT inhaler Inhale 2 puffs into the lungs every 6 (six) hours as needed for wheezing or shortness of breath. 11/20/18   Sharlene Dory, DO  fluticasone (FLONASE) 50 MCG/ACT nasal spray Place 2 sprays into both nostrils daily. 11/20/18   Sharlene Dory, DO  levocetirizine (XYZAL) 5 MG tablet Take 1 tablet (5 mg total) by mouth every evening. 11/20/18   Sharlene Dory, DO  traZODone (DESYREL) 50 MG tablet Take 0.5-1 tablets (25-50 mg total) by mouth at bedtime as needed for sleep. 07/14/19   Sharlene Dory, DO    Allergies Bee venom and Food   REVIEW OF SYSTEMS  Negative except as noted here or in the History of Present Illness.   PHYSICAL EXAMINATION  Initial Vital Signs Blood pressure (!) 142/81, pulse 85, temperature 98.4 F (36.9 C), temperature source Oral, resp. rate 18, height 5\' 6"  (1.676 m), weight 72 kg, SpO2 99 %.  Examination General: Well-developed, well-nourished male in no acute distress; appearance consistent with age of record HENT: normocephalic; atraumatic Eyes: Normal appearance Neck: supple Heart: regular rate and rhythm Lungs: clear to auscultation bilaterally Abdomen: soft; nondistended; nontender; bowel sounds present Extremities: No deformity; full range of motion; laceration of right middle finger with intact tendon function  and brisk capillary refill distally, but decreased sensation distal to the wound:    Neurologic: Awake, alert and oriented; motor function intact in all extremities and symmetric; no facial droop Skin: Warm and dry Psychiatric: Normal mood and affect   RESULTS  Summary of this visit's results, reviewed and interpreted by myself:   EKG Interpretation  Date/Time:    Ventricular Rate:    PR Interval:    QRS Duration:   QT Interval:    QTC Calculation:   R Axis:     Text  Interpretation:        Laboratory Studies: No results found for this or any previous visit (from the past 24 hour(s)). Imaging Studies: No results found.  ED COURSE and MDM  Nursing notes, initial and subsequent vitals signs, including pulse oximetry, reviewed and interpreted by myself.  Vitals:   11/07/19 0453 11/07/19 0455  BP:  (!) 142/81  Pulse:  85  Resp:  18  Temp:  98.4 F (36.9 C)  TempSrc:  Oral  SpO2:  99%  Weight: 72 kg   Height: 5\' 6"  (1.676 m)    Medications  lidocaine-EPINEPHrine (XYLOCAINE W/EPI) 2 %-1:100000 (with pres) injection 20 mL (has no administration in time range)  lidocaine-EPINEPHrine (XYLOCAINE W/EPI) 2 %-1:200000 (PF) injection (has no administration in time range)  cephALEXin (KEFLEX) capsule 1,000 mg (1,000 mg Oral Given 11/07/19 0505)    Wound repaired as described below.  Patient apparently has a digital nerve injury.  We will refer to hand if he wishes to pursue reevaluation by a specialist.  We will place him on Keflex for infection prophylaxis as this was a dirty wound.  PROCEDURES  Procedures  LACERATION REPAIR Performed by: 11/09/19 Zillah Alexie Authorized by: Carlisle Beers Shaena Parkerson Consent: Verbal consent obtained. Risks and benefits: risks, benefits and alternatives were discussed Consent given by: patient Patient identity confirmed: provided demographic data Prepped and Draped in normal sterile fashion Wound explored, no tendon injury seen  Laceration Location: Right middle finger  Laceration Length: 3.5 cm  No Foreign Bodies seen or palpated  Anesthesia: local infiltration  Local anesthetic: lidocaine 2% with epinephrine  Anesthetic total: 3 ml  Irrigation method: syringe Amount of cleaning: standard  Skin closure: 4-0 Prolene  Number of sutures: 7  Technique: Simple interrupted  Patient tolerance: Patient tolerated the procedure well with no immediate complications.     ED DIAGNOSES     ICD-10-CM   1. Laceration of  right middle finger without foreign body without damage to nail, initial encounter  S61.212A   2. Injury of digital nerve of right middle finger, initial encounter  Carlisle Beers        [id number], MD 11/07/19 [phone]

## 2019-11-07 NOTE — ED Triage Notes (Signed)
Patient presents with complaints of laceration to right middle finger.

## 2019-11-13 ENCOUNTER — Other Ambulatory Visit: Payer: Self-pay

## 2019-11-13 ENCOUNTER — Encounter (HOSPITAL_BASED_OUTPATIENT_CLINIC_OR_DEPARTMENT_OTHER): Payer: Self-pay

## 2019-11-13 ENCOUNTER — Emergency Department (HOSPITAL_BASED_OUTPATIENT_CLINIC_OR_DEPARTMENT_OTHER)
Admission: EM | Admit: 2019-11-13 | Discharge: 2019-11-13 | Disposition: A | Discharge status: Home / Self Care | Attending: Emergency Medicine | Admitting: Emergency Medicine

## 2019-11-13 ENCOUNTER — Emergency Department (HOSPITAL_BASED_OUTPATIENT_CLINIC_OR_DEPARTMENT_OTHER): Payer: Worker's Compensation | Attending: Physician Assistant

## 2019-11-13 DIAGNOSIS — Y999 Unspecified external cause status: Secondary | ICD-10-CM | POA: Diagnosis not present

## 2019-11-13 DIAGNOSIS — Y939 Activity, unspecified: Secondary | ICD-10-CM | POA: Insufficient documentation

## 2019-11-13 DIAGNOSIS — X58XXXD Exposure to other specified factors, subsequent encounter: Secondary | ICD-10-CM | POA: Insufficient documentation

## 2019-11-13 DIAGNOSIS — S61312D Laceration without foreign body of right middle finger with damage to nail, subsequent encounter: Secondary | ICD-10-CM | POA: Insufficient documentation

## 2019-11-13 DIAGNOSIS — J45909 Unspecified asthma, uncomplicated: Secondary | ICD-10-CM | POA: Insufficient documentation

## 2019-11-13 DIAGNOSIS — Y929 Unspecified place or not applicable: Secondary | ICD-10-CM | POA: Insufficient documentation

## 2019-11-13 DIAGNOSIS — L089 Local infection of the skin and subcutaneous tissue, unspecified: Secondary | ICD-10-CM | POA: Diagnosis not present

## 2019-11-13 DIAGNOSIS — S60942D Unspecified superficial injury of right middle finger, subsequent encounter: Secondary | ICD-10-CM | POA: Diagnosis present

## 2019-11-13 DIAGNOSIS — Z7722 Contact with and (suspected) exposure to environmental tobacco smoke (acute) (chronic): Secondary | ICD-10-CM | POA: Insufficient documentation

## 2019-11-13 DIAGNOSIS — T148XXA Other injury of unspecified body region, initial encounter: Secondary | ICD-10-CM

## 2019-11-13 DIAGNOSIS — F1590 Other stimulant use, unspecified, uncomplicated: Secondary | ICD-10-CM | POA: Diagnosis not present

## 2019-11-13 DIAGNOSIS — Z79899 Other long term (current) drug therapy: Secondary | ICD-10-CM | POA: Insufficient documentation

## 2019-11-13 MED ORDER — LIDOCAINE HCL 2 % IJ SOLN
10.0000 mL | Freq: Once | INTRAMUSCULAR | Status: AC
  Administered 2019-11-13: 200 mg via INTRADERMAL
  Filled 2019-11-13: qty 20

## 2019-11-13 MED ORDER — SULFAMETHOXAZOLE-TRIMETHOPRIM 800-160 MG PO TABS: 1.0000 | ORAL_TABLET | Freq: Two times a day (BID) | ORAL | 0 refills | Status: AC

## 2019-11-13 NOTE — ED Triage Notes (Addendum)
Pt arrives with concern of infection to right middle finger from laceration on last Friday. Pt has stitches intact with swelling to right middle finger. Some drainage from stiches, pt reports taking Keflex as directed.

## 2019-11-13 NOTE — ED Provider Notes (Signed)
[facility] EMERGENCY DEPARTMENT Provider Note   CSN: [id number] Arrival date & time: 11/13/19  0845     History Chief Complaint  Patient presents with  . Finger Injury    Kevin Bryan is a 24 y.o. male.  HPI   24 year old male with a history of asthma, hypospadias, nausea vomiting, varicella, who presents to the emergency department today complaining of pain to the right middle finger.  Patient sustained a laceration 8/27 and had sutures placed at that time.  He was placed on Keflex 4 times daily and states that he has been taking that for the last 5 to 6 days as directed.  Yesterday he noticed some increased swelling and redness around the wound.  He states he had some pus draining from several of the sutures and he ultimately took one of the sutures out.  He has had no fevers.  He has had decreased sensation since the injury occurred and that is unchanged today.  Past Medical History:  Diagnosis Date  . Asthma   . Hypospadias   . Nausea and vomiting   . Varicella     Patient Active Problem List   Diagnosis Date Noted  . Viral gastroenteritis 12/06/2018  . Herpes genitalis 08/29/2018  . 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 Family disruption 11/05/2012  . H/O chlamydia infection 10/09/2012  . GERD (gastroesophageal reflux disease) 10/04/2012  . Headache(784.0) 01/11/2012    Past Surgical History:  Procedure Laterality Date  . ADENOIDECTOMY    . ESOPHAGOGASTRODUODENOSCOPY  03/01/2012   Procedure: ESOPHAGOGASTRODUODENOSCOPY (EGD);  Surgeon: Jon Gills, MD;  Location: [facility] OR;  Service: Gastroenterology;  Laterality: N/A;  . TONSILLECTOMY         Family History  Problem Relation Age of Onset  . Learning disabilities Sister   . Cholelithiasis Maternal Aunt   . Asthma Mother   . Depression Mother   . Miscarriages / [place] Mother   . Asthma Maternal Grandmother   . Cancer Maternal Grandmother   . Heart disease Maternal Grandmother   .  Diabetes Maternal Grandmother   . Alcohol abuse Maternal Grandfather   . Cancer Maternal Grandfather   . Arthritis Maternal Grandfather   . Heart disease Maternal Grandfather   . Diabetes Maternal Grandfather   . Hyperlipidemia Maternal Grandfather   . Hypertension Maternal Grandfather     Social History   Tobacco Use  . Smoking status: Passive Smoke Exposure - Never Smoker  . Smokeless tobacco: Never Used  . Tobacco comment: mom smokes   Substance Use Topics  . Alcohol use: Yes    Alcohol/week: 0.0 standard drinks  . Drug use: Yes    Types: Marijuana    Comment: Tried marijuana once age 12    Home Medications Prior to Admission medications   Medication Sig Start Date End Date Taking? Authorizing Provider  albuterol (VENTOLIN HFA) 108 (90 Base) MCG/ACT inhaler Inhale 2 puffs into the lungs every 6 (six) hours as needed for wheezing or shortness of breath. 11/20/18   Sharlene Dory, DO  cephALEXin (KEFLEX) 500 MG capsule Take 1 capsule (500 mg total) by mouth 4 (four) times daily. 11/07/19   Molpus, John, MD  fluticasone (FLONASE) 50 MCG/ACT nasal spray Place 2 sprays into both nostrils daily. 11/20/18   Sharlene Dory, DO  levocetirizine (XYZAL) 5 MG tablet Take 1 tablet (5 mg total) by mouth every evening. 11/20/18   Sharlene Dory, DO  sulfamethoxazole-trimethoprim (BACTRIM DS) 800-160 MG tablet Take 1  tablet by mouth 2 (two) times daily for 7 days. 11/13/19 11/20/19  Kaylyn Garrow S, PA-C  traZODone (DESYREL) 50 MG tablet Take 0.5-1 tablets (25-50 mg total) by mouth at bedtime as needed for sleep. 07/14/19   Sharlene Dory, DO    Allergies    Bee venom and Food  Review of Systems   Review of Systems  Constitutional: Negative for fever.  Musculoskeletal:       Right middle finger pain  Skin: Positive for color change and wound.  Neurological: Positive for numbness. Negative for weakness.    Physical Exam Updated Vital Signs BP 116/82 (BP  Location: Left Arm)   Pulse 98   Temp 98 F (36.7 C) (Oral)   Resp 18   Ht 5\' 6"  (1.676 m)   Wt 72 kg   SpO2 99%   BMI 25.62 kg/m   Physical Exam Constitutional:      General: He is not in acute distress.    Appearance: He is well-developed.  Eyes:     Conjunctiva/sclera: Conjunctivae normal.  Cardiovascular:     Rate and Rhythm: Normal rate.  Pulmonary:     Effort: Pulmonary effort is normal.  Musculoskeletal:     Comments: TTP is swelling and erythema overlying the mid phalanx on the palmar aspect of the right middle finger.  There is no obvious pus drainage on exam.  There is no fusiform swelling, pain with extension or tenderness along the flexor tendon sheath.  See picture below.  Skin:    General: Skin is warm and dry.  Neurological:     Mental Status: He is alert and oriented to person, place, and time.            ED Results / Procedures / Treatments   Labs (all labs ordered are listed, but only abnormal results are displayed) Labs Reviewed - No data to display  EKG None  Radiology DG Finger Middle Right  Result Date: 11/13/2019 CLINICAL DATA:  Right middle finger laceration with increasing swelling and redness EXAM: RIGHT MIDDLE FINGER 2+V COMPARISON:  04/13/2010 FINDINGS: No acute fracture. No dislocation. No cortical destruction or periostitis. There is prominent soft tissue swelling of the right long finger most pronounced at the palmar and radial aspects of the long finger middle phalanx. No soft tissue gas or radiopaque foreign body. IMPRESSION: 1. No acute osseous abnormality. 2. Prominent soft tissue swelling of the long finger. No soft tissue gas or radiopaque foreign body. Electronically Signed   By: 06/12/2010 D.O.   On: 11/13/2019 10:55    Procedures .Suture Removal  Date/Time: 11/13/2019 12:38 PM Performed by: 01/13/2020, PA-C Authorized by: Karrie Meres, PA-C   Consent:    Consent obtained:  Verbal   Consent given by:   Patient   Risks discussed:  Bleeding, pain and wound separation   Alternatives discussed:  No treatment Location:    Location: finger. Procedure details:    Wound appearance:  No signs of infection   Number of sutures removed:  6 (1 suture had already been removed by the patient) Post-procedure details:    Post-removal:  Dressing applied   Patient tolerance of procedure:  Tolerated well, no immediate complications .Karrie MeresLaceration Repair  Date/Time: 11/13/2019 12:39 PM Performed by: 01/13/2020, PA-C Authorized by: Karrie Meres, PA-C   Consent:    Consent obtained:  Verbal   Consent given by:  Patient   Risks discussed:  Infection, poor cosmetic result, pain and  need for additional repair Anesthesia (see MAR for exact dosages):    Anesthesia method:  Local infiltration   Local anesthetic:  Lidocaine 2% w/o epi Laceration details:    Location:  Finger   Finger location:  R long finger   Length (cm):  3 Repair type:    Repair type:  Simple Pre-procedure details:    Preparation:  Patient was prepped and draped in usual sterile fashion and imaging obtained to evaluate for foreign bodies Exploration:    Hemostasis achieved with:  Direct pressure   Wound exploration: wound explored through full range of motion   Treatment:    Area cleansed with:  Saline   Amount of cleaning:  Standard   Irrigation solution:  Sterile saline   Irrigation volume:  2L   Irrigation method:  Pressure wash   Visualized foreign bodies/material removed: no   Skin repair:    Repair method:  Sutures   Suture size:  5-0   Suture material:  Prolene   Suture technique:  Simple interrupted   Number of sutures:  2 Approximation:    Approximation:  Close Post-procedure details:    Dressing:  Sterile dressing   Patient tolerance of procedure:  Tolerated well, no immediate complications   (including critical care time)  Medications Ordered in ED Medications  lidocaine (XYLOCAINE) 2 % (with pres)  injection 200 mg (200 mg Intradermal Given by Other 11/13/19 1131)    ED Course  I have reviewed the triage vital signs and the nursing notes.  Pertinent labs & imaging results that were available during my care of the patient were reviewed by me and considered in my medical decision making (see chart for details).    MDM Rules/Calculators/A&P                          23 year old male presenting for evaluation of right middle finger infection.  Had stitches placed about 6 days ago after a work injury.  Yesterday started having increased pain swelling and redness.  Has been on Keflex for almost 1 week.  He denies any fevers or systemic syndromes.  Xray reviewed/interpreted - soft tissue swelling noted, but no evidence of osseous abnormality, soft tissue gas or FB  11:17 AM CONSULT with Dr. Roney Mans with hand surgery.  He reviewed pictures and recommends taking stitches out, opening wound, irrigating thoroughly and loosely closing wound.  Recommended switching antibiotic to Bactrim and he will see the patient in clinic tomorrow for reassessment.  Sutures removed, wound irrigated thoroughly and was loosely closed again with sutures.  Plan for follow-up with hand surgery and switching antibiotics.  Advised on follow-up and return precautions.  He voices understanding of plan reasons return for all questions answered.  Patient stable for discharge.  Final Clinical Impression(s) / ED Diagnoses Final diagnoses:  Wound infection    Rx / DC Orders ED Discharge Orders         Ordered    sulfamethoxazole-trimethoprim (BACTRIM DS) 800-160 MG tablet  2 times daily        11/13/19 [address] Dao Memmott S, PA-C 11/13/19 1240    Pricilla Loveless, MD 11/13/19 1312

## 2019-11-13 NOTE — ED Notes (Signed)
1130 lidocaine and I&D kit placed at bedside

## 2019-11-13 NOTE — Discharge Instructions (Signed)
We plan to switch her antibiotic to Bactrim interim instead of Keflex.  You will need to start taking this today.  You were given a referral to a hand surgeon.  You will need to call the office today to schedule an appointment for follow-up tomorrow.  Please return to the emergency room immediately if you experience any new or worsening symptoms or any symptoms that indicate worsening infection such as fevers, increased redness/swelling/pain, warmth, or drainage from the affected area.

## 2019-11-19 ENCOUNTER — Other Ambulatory Visit: Payer: Self-pay

## 2019-11-19 ENCOUNTER — Ambulatory Visit: Payer: BC Managed Care – PPO | Admitting: Family Medicine

## 2019-11-19 ENCOUNTER — Encounter: Payer: Self-pay | Admitting: Family Medicine

## 2019-11-19 VITALS — BP 110/68 | HR 84 | Temp 98.4°F | Ht 66.0 in | Wt 157.5 lb

## 2019-11-19 DIAGNOSIS — G4709 Other insomnia: Secondary | ICD-10-CM

## 2019-11-19 MED ORDER — MIRTAZAPINE 30 MG PO TABS: 30.0000 mg | ORAL_TABLET | Freq: Every day | ORAL | 3 refills | Status: DC

## 2019-11-19 NOTE — Patient Instructions (Signed)
Sleep Hygiene Tips: Do not watch TV or look at screens within 1 hour of going to bed. If you do, make sure there is a blue light filter (nighttime mode) involved. Try to go to bed around the same time every night. Wake up at the same time within 1 hour of regular time. Ex: If you wake up at 7 AM for work, do not sleep past 8 AM on days that you don't work. Do not drink alcohol before bedtime. Do not consume caffeine-containing beverages after noon or within 9 hours of intended bedtime. Get regular exercise/physical activity in your life, but not within 2 hours of planned bedtime. Do not take naps.  Do not eat within 2 hours of planned bedtime. Melatonin, 3-5 mg 30-60 minutes before planned bedtime may be helpful.  The bed should be for sleep or sex only. If after 20-30 minutes you are unable to fall asleep, get up and do something relaxing. Do this until you feel ready to go to sleep again.   Sleep is important to us all. Getting good sleep is imperative to adequate functioning during the day. Work with our counselors who are trained to help people obtain quality sleep. Call [phone] to schedule an appointment or if you are curious about insurance coverage/cost.  Aim to do some physical exertion for 150 minutes per week. This is typically divided into 5 days per week, 30 minutes per day. The activity should be enough to get your heart rate up. Anything is better than nothing if you have time constraints.  Let us know if you need anything.  

## 2019-11-19 NOTE — Progress Notes (Signed)
Chief Complaint  Patient presents with  . Follow-up    sleep problems    Subjective: Patient is a 24 y.o. male here for sleep issues.  Has difficulty sleeping, getting 3-5 hrs/night.  He feels very tired and sluggish during the day.  Overall anxiety is not an issue for him.  He has not yet heard from the sleep team.  The number was given to him today.  He is currently working third shift, though this change is not significant with his sleep.  He was taking trazodone which worked some of the time.  Since changing to third shift, it is no longer effective at all.  He has never been on anything else.  Past Medical History:  Diagnosis Date  . Asthma   . Hypospadias   . Nausea and vomiting   . Varicella     Objective: BP 110/68 (BP Location: Left Arm, Patient Position: Sitting, Cuff Size: Normal)   Pulse 84   Temp 98.4 F (36.9 C) (Oral)   Ht 5\' 6"  (1.676 m)   Wt 157 lb 8 oz (71.4 kg)   SpO2 98%   BMI 25.42 kg/m  General: Awake, appears stated age Lungs: No accessory muscle use Psych: Age appropriate judgment and insight, normal affect and mood  Assessment and Plan: Sleep initiation disorder - Plan: mirtazapine (REMERON) 30 MG tablet  Start Remeron.  Contact information from of our pulmonary provided.  Need to get set up with them.  Sleep hygiene and counseling information provided.  I will see him in 1 month recheck above. The patient voiced understanding and agreement to the plan.  [place], DO 11/19/19  1:14 PM

## 2019-12-11 ENCOUNTER — Telehealth: Payer: Self-pay | Admitting: Family Medicine

## 2019-12-11 NOTE — Telephone Encounter (Signed)
Spoke with Erie Noe told her patient hasn't had labs since 09/2018  & made her aware of patient's last visit was about sleep disorder & she stated mediation list and problem list are fine , so paperwork has been faxed.

## 2019-12-11 NOTE — Telephone Encounter (Signed)
[facility] Call Back # [phone]   States received medical clearance form but no progress note or labs results were attached.  Fax to [phone]

## 2019-12-23 ENCOUNTER — Ambulatory Visit: Payer: BC Managed Care – PPO | Admitting: Family Medicine

## 2019-12-26 ENCOUNTER — Other Ambulatory Visit: Payer: Self-pay

## 2019-12-26 ENCOUNTER — Ambulatory Visit (INDEPENDENT_AMBULATORY_CARE_PROVIDER_SITE_OTHER): Payer: BC Managed Care – PPO | Admitting: Pulmonary Disease

## 2019-12-26 ENCOUNTER — Encounter: Payer: Self-pay | Admitting: Pulmonary Disease

## 2019-12-26 DIAGNOSIS — G4733 Obstructive sleep apnea (adult) (pediatric): Secondary | ICD-10-CM | POA: Diagnosis not present

## 2019-12-26 DIAGNOSIS — G4709 Other insomnia: Secondary | ICD-10-CM | POA: Diagnosis not present

## 2019-12-26 NOTE — Patient Instructions (Addendum)
Home sleep test Insomnia is difficult to explain  Rules of sleep hygiene were discussed  -avoid caffeinated beverages towards end of shift - no more than 20 mins staying awake in bed, if not asleep, get out of bed & reading or light music - No TV or computer games at bedtime. - Avoid sunlight exposure after night shift - sunglasses

## 2019-12-26 NOTE — Assessment & Plan Note (Addendum)
Insomnia is difficult to explain .  This is a fairly recent onset he seems to be well adjusted, no anxiety or depression superimposed. Remeron seems to be helping somewhat. 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 I emphasized decreasing exposure to lights off he has to stay up to 11 AM after the night shift he should be wearing sun glasses.   Rules of sleep hygiene were discussed  -avoid caffeinated beverages towards end of shift - no more than 20 mins staying awake in bed, if not asleep, get out of bed & reading or light music - No TV or computer games at bedtime. - Avoid sunlight exposure after night shift - sunglasses -Trial of melatonin

## 2019-12-26 NOTE — Progress Notes (Signed)
Subjective:    Patient ID: Kevin Bryan, male    DOB: 1995-10-26, 24 y.o.   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  He reports that he has always had sporadic problems getting to sleep but this has been more consistent over the past year. His wife is also noted that he stops breathing on occasion.  He has a 54-year-old daughter who was born with a cleft palate and who is on oxygen due to apneas.  His dad has OSA and weighs 400 pounds.  For the past 6 months he has been working the third shift, prior to this he was working for shift. As a teenager he always had good sleep habits with bedtime around 10 PM and wake up much before 8 AM.  For sleep onset insomnia, he was tried on trazodone which did not work and Remeron has been started which she has been using for about a month. Bedtime is between 11 AM and 2:30 PM, sleep latency is variable but sometimes can be hours, he sleeps in the prone position or on his side, reports 2-3 nocturnal awakenings and is out of bed by 9 PM feeling rested with occasional headaches but denies dryness of mouth. Epworth sleepiness score is 10 He has gained about 30 pounds in the last 2 years. He drinks 1 to 2 cups of coffee and drinks at least 3 cans of Dr. Reino Kent through the day and occasional sweet tea.  Occasionally will drink a red bull   There is no history suggestive of cataplexy, sleep paralysis or parasomnias     Past Medical History:  Diagnosis Date  . Asthma   . Hypospadias   . Nausea and vomiting   . Varicella     Past Surgical History:  Procedure Laterality Date  . ADENOIDECTOMY    . ESOPHAGOGASTRODUODENOSCOPY  03/01/2012   Procedure: ESOPHAGOGASTRODUODENOSCOPY (EGD);  Surgeon: Jon Gills, MD;  Location: [facility] OR;  Service: Gastroenterology;  Laterality: N/A;  . TONSILLECTOMY      Allergies  Allergen Reactions  . Bee Venom Swelling  . Food Itching     Maple Syrup-mouth itches      Social History   Socioeconomic History  . Marital status: Married    Spouse name: Not on file  . Number of children: Not on file  . Years of education: Not on file  . Highest education level: Not on file  Occupational History  . Not on file  Tobacco Use  . Smoking status: Passive Smoke Exposure - Never Smoker  . Smokeless tobacco: Never Used  . Tobacco comment: mom smokes   Substance and Sexual Activity  . Alcohol use: Yes    Alcohol/week: 0.0 standard drinks  . Drug use: Yes    Types: Marijuana    Comment: Tried marijuana once age 7  . Sexual activity: Not on file    Comment: uses condoms  Other Topics Concern  . Not on file  Social History Narrative   Lives at home with mom, mom's boyfriend, and grandfather. Sister lives with boyfriend. Graduated from high school.   Social Determinants of Health   Financial Resource Strain:   . Difficulty of Paying Living Expenses: Not on file  Food Insecurity:   . Worried About Programme researcher, broadcasting/film/video in the Last Year: Not on file  . Ran Out of Food in the Last Year: Not on file  Transportation Needs:   .  Lack of Transportation (Medical): Not on file  . Lack of Transportation (Non-Medical): Not on file  Physical Activity:   . Days of Exercise per Week: Not on file  . Minutes of Exercise per Session: Not on file  Stress:   . Feeling of Stress : Not on file  Social Connections:   . Frequency of Communication with Friends and Family: Not on file  . Frequency of Social Gatherings with Friends and Family: Not on file  . Attends Religious Services: Not on file  . Active Member of Clubs or Organizations: Not on file  . Attends Banker Meetings: Not on file  . Marital Status: Not on file  Intimate Partner Violence:   . Fear of Current or Ex-Partner: Not on file  . Emotionally Abused: Not on file  . Physically Abused: Not on file  . Sexually Abused: Not on file     Review of  Systems  Acid heartburn Tooth problems  Constitutional: negative for anorexia, fevers and sweats  Eyes: negative for irritation, redness and visual disturbance  Ears, nose, mouth, throat, and face: negative for earaches, epistaxis, nasal congestion and sore throat  Respiratory: negative for cough, dyspnea on exertion, sputum and wheezing  Cardiovascular: negative for chest pain, dyspnea, lower extremity edema, orthopnea, palpitations and syncope  Gastrointestinal: negative for abdominal pain, constipation, diarrhea, melena, nausea and vomiting  Genitourinary:negative for dysuria, frequency and hematuria  Hematologic/lymphatic: negative for bleeding, easy bruising and lymphadenopathy  Musculoskeletal:negative for arthralgias, muscle weakness and stiff joints  Neurological: negative for coordination problems, gait problems, headaches and weakness  Endocrine: negative for diabetic symptoms including polydipsia, polyuria and weight loss     Objective:   Physical Exam  Gen. Pleasant, well-nourished, in no distress, normal affect ENT - no pallor,icterus, no post nasal drip Neck: No JVD, no thyromegaly, no carotid bruits Lungs: no use of accessory muscles, no dullness to percussion, clear without rales or rhonchi  Cardiovascular: Rhythm regular, heart sounds  normal, no murmurs or gallops, no peripheral edema Abdomen: soft and non-tender, no hepatosplenomegaly, BS normal. Musculoskeletal: No deformities, no cyanosis or clubbing Neuro:  alert, non focal       Assessment & Plan:

## 2019-12-26 NOTE — Assessment & Plan Note (Signed)
Due to witnessed apneas, reasonable to investigate with home sleep test.  The pathophysiology of obstructive sleep apnea , it's cardiovascular consequences & modes of treatment including CPAP were discused with the patient in detail & they evidenced understanding.

## 2020-02-11 ENCOUNTER — Other Ambulatory Visit: Payer: Self-pay | Admitting: Family Medicine

## 2020-02-11 DIAGNOSIS — Z3009 Encounter for other general counseling and advice on contraception: Secondary | ICD-10-CM

## 2020-02-12 DIAGNOSIS — G4733 Obstructive sleep apnea (adult) (pediatric): Secondary | ICD-10-CM

## 2020-03-08 ENCOUNTER — Ambulatory Visit: Payer: BC Managed Care – PPO

## 2020-03-08 ENCOUNTER — Other Ambulatory Visit: Payer: Self-pay

## 2020-03-08 DIAGNOSIS — G4733 Obstructive sleep apnea (adult) (pediatric): Secondary | ICD-10-CM

## 2020-03-16 ENCOUNTER — Telehealth: Payer: Self-pay | Admitting: Pulmonary Disease

## 2020-03-16 DIAGNOSIS — G4733 Obstructive sleep apnea (adult) (pediatric): Secondary | ICD-10-CM | POA: Diagnosis not present

## 2020-03-16 NOTE — Telephone Encounter (Signed)
HST showed mild  OSA with AHI 7/ hr  Study was suboptimal since he only slept for 4 h We can consider repeating home sleep test For this degree of mild OSA, we can offer no treatment or since he is very symptomatic can offer dental appliance or CPAP device

## 2020-03-17 NOTE — Telephone Encounter (Signed)
Called and left message on voicemail to please return phone call to go over results. Contact number provided. 

## 2020-03-18 NOTE — Telephone Encounter (Signed)
Called and left message on voicemail to please return phone call to go over HST results. Contact number provided and due to this being the 2nd attempt, letter written and sent to please call us for results.

## 2020-03-18 NOTE — Telephone Encounter (Signed)
Patient returned phone call, writer went over HST results per Dr Vassie Loll. All questions answered and patient expressed understanding. Patient would like to try the dental appliance. Dr Vassie Loll, do you want me to order the referral to orthodontist for dental appliance?

## 2020-03-19 ENCOUNTER — Other Ambulatory Visit: Payer: Self-pay

## 2020-03-19 DIAGNOSIS — G4733 Obstructive sleep apnea (adult) (pediatric): Secondary | ICD-10-CM

## 2020-03-19 NOTE — Telephone Encounter (Signed)
Please order

## 2020-03-19 NOTE — Telephone Encounter (Signed)
Order placed per Verbal from Dr Vassie Loll for referral to Dr Toni Arthurs for dental appliance. Nothing further needed at this time.

## 2020-04-19 ENCOUNTER — Ambulatory Visit (INDEPENDENT_AMBULATORY_CARE_PROVIDER_SITE_OTHER): Payer: BC Managed Care – PPO | Admitting: Family Medicine

## 2020-04-19 ENCOUNTER — Encounter: Payer: Self-pay | Admitting: Family Medicine

## 2020-04-19 ENCOUNTER — Other Ambulatory Visit: Payer: Self-pay

## 2020-04-19 VITALS — BP 106/68 | HR 68 | Temp 97.7°F | Ht 66.0 in | Wt 165.4 lb

## 2020-04-19 DIAGNOSIS — Z01818 Encounter for other preprocedural examination: Secondary | ICD-10-CM | POA: Diagnosis not present

## 2020-04-19 NOTE — Progress Notes (Signed)
Subjective:   Chief Complaint  Patient presents with  . Medical Clearance    Kevin Bryan  is here for a Pre-operative physical with [facility].    He is having wisdom tooth removal surgery on 3/2 for broken wisdom tooth.  Personal or family hx of adverse outcome to anesthesia? No  Chipped, cracked, missing, or loose teeth? Yes - maxillary wisdom tooth on R Decreased ROM of neck? No  Able to walk up 2 flights of stairs without becoming significantly short of breath or having chest pain? Yes   Revised Goldman Criteria: High Risk Surgery (intraperitoneal, intrathoracic, aortic): No  Ischemic heart disease (Prior MI, +excercise stress test, angina, nitrate use, Qwave): No  History of heart failure: No  History of cerebrovascular disease: No  History of diabetes: No  Insulin therapy for DM: No  Preoperative Cr >2.0: No   Revised Goldman Criteria - risk for major cardiac death No risk factors -- 0.4 percent One risk factor -- 1.0 percent  Two risk factors -- 2.4 percent  Three or more risk factors -- 5.4 percent   Patient Active Problem List   Diagnosis Date Noted  . OSA (obstructive sleep apnea) 12/26/2019  . Viral gastroenteritis 12/06/2018  . Herpes genitalis 08/29/2018  . 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 Family disruption 11/05/2012  . H/O chlamydia infection 10/09/2012  . GERD (gastroesophageal reflux disease) 10/04/2012  . Headache(784.0) 01/11/2012   Past Medical History:  Diagnosis Date  . Asthma   . Hypospadias   . Nausea and vomiting   . Varicella     Past Surgical History:  Procedure Laterality Date  . ADENOIDECTOMY    . ESOPHAGOGASTRODUODENOSCOPY  03/01/2012   Procedure: ESOPHAGOGASTRODUODENOSCOPY (EGD);  Surgeon: Jon Gills, MD;  Location: [facility] OR;  Service: Gastroenterology;  Laterality: N/A;  . TONSILLECTOMY      Current Outpatient Medications  Medication Sig Dispense Refill  . albuterol (VENTOLIN HFA) 108 (90 Base) MCG/ACT inhaler Inhale  2 puffs into the lungs every 6 (six) hours as needed for wheezing or shortness of breath. 18 g 2  . fluticasone (FLONASE) 50 MCG/ACT nasal spray Place 2 sprays into both nostrils daily. 16 g 6  . mirtazapine (REMERON) 30 MG tablet Take 1 tablet (30 mg total) by mouth at bedtime. 30 tablet 3    Allergies  Allergen Reactions  . Bee Venom Swelling  . Food Itching    Maple Syrup-mouth itches     Family History  Problem Relation Age of Onset  . Learning disabilities Sister   . Cholelithiasis Maternal Aunt   . Asthma Mother   . Depression Mother   . Miscarriages / [place] Mother   . Asthma Maternal Grandmother   . Cancer Maternal Grandmother   . Heart disease Maternal Grandmother   . Diabetes Maternal Grandmother   . Alcohol abuse Maternal Grandfather   . Cancer Maternal Grandfather   . Arthritis Maternal Grandfather   . Heart disease Maternal Grandfather   . Diabetes Maternal Grandfather   . Hyperlipidemia Maternal Grandfather   . Hypertension Maternal Grandfather      Review of Systems:  Constitutional:  no fevers Eye:  no recent significant change in vision Ear:  no hearing loss Nose/Mouth/Throat:  No dental complaints Neck/Thyroid:  no lumps or masses Pulmonary:  No shortness of breath Cardiovascular:  no chest pain Gastrointestinal:  no abdominal pain GU:  negative for dysuria Musculoskeletal/Extremities:  no pain Skin/Integumentary ROS:  no abnormal skin lesions reported Neurologic:  no  HA  Objective:   Vitals:   04/19/20 0727  BP: 106/68  Pulse: 68  Temp: 97.7 F (36.5 C)  TempSrc: Oral  SpO2: 99%  Weight: 165 lb 6 oz (75 kg)  Height: 5\' 6"  (1.676 m)   Body mass index is 26.69 kg/m.  General:  well developed, well nourished, in no apparent distress Skin:  warm, no pallor or diaphoresis Head:  normocephalic, atraumatic Eyes:  pupils equal and round, sclera anicteric without injection Ears: External ear without lesion Throat/Pharynx:  lips and  gingiva without lesion; tongue and uvula midline; non-inflamed pharynx; no exudates or postnasal drainage Neck: neck without gross asymmetry Lungs:  clear to auscultation, breath sounds equal bilaterally, no respiratory distress Cardio:  regular rate and rhythm without murmurs Abdomen:  abdomen with normal shape Musculoskeletal:  symmetrical muscle groups noted without atrophy or deformity Extremities:  no clubbing, cyanosis, or edema, no deformities, no skin discoloration Neuro:  gait normal Psych: Age appropriate judgment and insight; normal mood   Assessment:   Preop examination   Plan:   He is having a low risk procedure and he has no medical issues.  No previous issues with anesthesia of any sort.  Due to his low risk factor count, I do not see any indication for laboratory studies or EKG barring a request from his surgeon.  The patient voiced understanding and agreement to the plan.  Edmond, DO 04/19/20  7:47 AM

## 2020-04-19 NOTE — Patient Instructions (Signed)
I don't see any contraindication for surgery.  Please have their office fax me anything that needs completion.  Let us know if you need anything.

## 2020-04-23 ENCOUNTER — Other Ambulatory Visit (HOSPITAL_COMMUNITY): Payer: Self-pay | Admitting: Urology

## 2020-04-23 MED FILL — HYDROCODON-APAP 5-325: 5-325 | 2 days supply | Qty: 6 | Fill #0

## 2020-04-23 MED FILL — DIAZEPAM 10 MG TABS: 10 | 1 days supply | Qty: 1 | Fill #0

## 2020-04-28 ENCOUNTER — Telehealth: Payer: Self-pay | Admitting: Pulmonary Disease

## 2020-04-28 ENCOUNTER — Telehealth: Payer: Self-pay | Admitting: Family Medicine

## 2020-04-28 NOTE — Telephone Encounter (Signed)
Called and spoke with patient who is requesting to have his home sleep [facility] at  [phone]. Study has been faxed over. Nothing further needed at this time.

## 2020-04-28 NOTE — Telephone Encounter (Signed)
Patient is calling to give you fax number to [facility] [phone]  Need office notes, physical note, and history

## 2020-04-28 NOTE — Telephone Encounter (Signed)
Have printed and faxed information requested. Called the patient to inform

## 2021-02-15 ENCOUNTER — Other Ambulatory Visit: Payer: Self-pay

## 2021-02-15 ENCOUNTER — Emergency Department (HOSPITAL_BASED_OUTPATIENT_CLINIC_OR_DEPARTMENT_OTHER)
Admission: EM | Admit: 2021-02-15 | Discharge: 2021-02-15 | Disposition: A | Discharge status: Home / Self Care | Payer: BC Managed Care – PPO | Attending: Emergency Medicine | Admitting: Emergency Medicine

## 2021-02-15 ENCOUNTER — Encounter (HOSPITAL_BASED_OUTPATIENT_CLINIC_OR_DEPARTMENT_OTHER): Payer: Self-pay

## 2021-02-15 ENCOUNTER — Emergency Department (HOSPITAL_BASED_OUTPATIENT_CLINIC_OR_DEPARTMENT_OTHER): Payer: BC Managed Care – PPO

## 2021-02-15 DIAGNOSIS — J45909 Unspecified asthma, uncomplicated: Secondary | ICD-10-CM | POA: Insufficient documentation

## 2021-02-15 DIAGNOSIS — Z7722 Contact with and (suspected) exposure to environmental tobacco smoke (acute) (chronic): Secondary | ICD-10-CM | POA: Insufficient documentation

## 2021-02-15 DIAGNOSIS — L03213 Periorbital cellulitis: Secondary | ICD-10-CM | POA: Insufficient documentation

## 2021-02-15 LAB — CBC WITH DIFFERENTIAL/PLATELET
Abs Immature Granulocytes: 0.01 10*3/uL (ref 0.00–0.07)
Basophils Absolute: 0 10*3/uL (ref 0.0–0.1)
Basophils Relative: 0 %
Eosinophils Absolute: 0.3 10*3/uL (ref 0.0–0.5)
Eosinophils Relative: 5 %
HCT: 42.8 % (ref 39.0–52.0)
Hemoglobin: 14.3 g/dL (ref 13.0–17.0)
Immature Granulocytes: 0 %
Lymphocytes Relative: 27 %
Lymphs Abs: 1.8 10*3/uL (ref 0.7–4.0)
MCH: 29.9 pg (ref 26.0–34.0)
MCHC: 33.4 g/dL (ref 30.0–36.0)
MCV: 89.5 fL (ref 80.0–100.0)
Monocytes Absolute: 0.6 10*3/uL (ref 0.1–1.0)
Monocytes Relative: 8 %
Neutro Abs: 3.9 10*3/uL (ref 1.7–7.7)
Neutrophils Relative %: 60 %
Platelets: 227 10*3/uL (ref 150–400)
RBC: 4.78 MIL/uL (ref 4.22–5.81)
RDW: 12.1 % (ref 11.5–15.5)
WBC: 6.6 10*3/uL (ref 4.0–10.5)
nRBC: 0 % (ref 0.0–0.2)

## 2021-02-15 LAB — BASIC METABOLIC PANEL
Anion gap: 7 (ref 5–15)
BUN: 11 mg/dL (ref 6–20)
CO2: 28 mmol/L (ref 22–32)
Calcium: 9.2 mg/dL (ref 8.9–10.3)
Chloride: 103 mmol/L (ref 98–111)
Creatinine, Ser: 1.05 mg/dL (ref 0.61–1.24)
GFR, Estimated: >60 mL/min (ref >60)
Glucose, Bld: 87 mg/dL (ref 70–99)
Potassium: 3.8 mmol/L (ref 3.5–5.1)
Sodium: 138 mmol/L (ref 135–145)

## 2021-02-15 MED ORDER — IOHEXOL 300 MG/ML  SOLN
100.0000 mL | Freq: Once | INTRAMUSCULAR | Status: AC | PRN
  Administered 2021-02-15: 80 mL via INTRAVENOUS

## 2021-02-15 MED ORDER — AMOXICILLIN-POT CLAVULANATE 875-125 MG PO TABS: 1.0000 | ORAL_TABLET | Freq: Two times a day (BID) | ORAL | 0 refills | Status: AC

## 2021-02-15 NOTE — ED Provider Notes (Signed)
[facility] EMERGENCY DEPARTMENT Provider Note   CSN: [id number] Arrival date & time: 02/15/21  1012     History Chief Complaint  Patient presents with   Facial Swelling    Kevin Bryan is a 25 y.o. male.  Here for evaluation of right facial swelling that began 2 days ago.  Started under his eye with some itching.  More swollen when he woke up yesterday.  Went to urgent care and was given eyedrops and antibiotics that he does not know the name of.  This morning had more swelling and also went back to urgent care where they recommended he come here.  No blurry vision double vision.  No eye pain.  No dental pain.  He said the swelling is now feels like its above and below his eye into his right cheek and he feels a knot under his right jaw.  No difficulty swallowing or speaking.  No fevers.  No trauma.  Wears eyeglasses as needed and no contact lenses.  The history is provided by the patient.  Eye Problem Location:  Right eye Quality:  Dull Severity:  Mild Onset quality:  Gradual Duration:  3 days Timing:  Constant Progression:  Worsening Chronicity:  New Context: not direct trauma   Relieved by:  Nothing Worsened by:  Nothing Ineffective treatments:  Eye drops (Antibiotics) Associated symptoms: swelling   Associated symptoms: no blurred vision, no crusting, no decreased vision, no double vision, no headaches, no nausea, no photophobia and no vomiting       Past Medical History:  Diagnosis Date   Asthma    Hypospadias    Nausea and vomiting    Varicella     Patient Active Problem List   Diagnosis Date Noted   OSA (obstructive sleep apnea) 12/26/2019   Viral gastroenteritis 12/06/2018   Herpes genitalis 08/29/2018   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   Family disruption 11/05/2012   H/O chlamydia infection 10/09/2012   GERD (gastroesophageal reflux disease) 10/04/2012   Headache(784.0) 01/11/2012    Past Surgical History:  Procedure Laterality Date    ADENOIDECTOMY     ESOPHAGOGASTRODUODENOSCOPY  03/01/2012   Procedure: ESOPHAGOGASTRODUODENOSCOPY (EGD);  Surgeon: Jon Gills, MD;  Location: [facility] OR;  Service: Gastroenterology;  Laterality: N/A;   TONSILLECTOMY         Family History  Problem Relation Age of Onset   Learning disabilities Sister    Cholelithiasis Maternal Aunt    Asthma Mother    Depression Mother    Miscarriages / Stillbirths Mother    Asthma Maternal Grandmother    Cancer Maternal Grandmother    Heart disease Maternal Grandmother    Diabetes Maternal Grandmother    Alcohol abuse Maternal Grandfather    Cancer Maternal Grandfather    Arthritis Maternal Grandfather    Heart disease Maternal Grandfather    Diabetes Maternal Grandfather    Hyperlipidemia Maternal Grandfather    Hypertension Maternal Grandfather     Social History   Tobacco Use   Smoking status: Passive Smoke Exposure - Never Smoker   Smokeless tobacco: Never   Tobacco comments:    mom smokes   Substance Use Topics   Alcohol use: Yes    Alcohol/week: 0.0 standard drinks   Drug use: Yes    Types: Marijuana    Comment: Tried marijuana once age 52    Home Medications Prior to Admission medications   Medication Sig Start Date End Date Taking? Authorizing Provider  albuterol (VENTOLIN HFA) 108 (90  Base) MCG/ACT inhaler Inhale 2 puffs into the lungs every 6 (six) hours as needed for wheezing or shortness of breath. 11/20/18   Sharlene Dory, DO    Allergies    Bee venom and Food  Review of Systems   Review of Systems  Constitutional:  Negative for fever.  HENT:  Positive for facial swelling. Negative for sore throat.   Eyes:  Negative for blurred vision, double vision and photophobia.  Respiratory:  Negative for cough.   Cardiovascular:  Negative for chest pain.  Gastrointestinal:  Negative for nausea and vomiting.  Genitourinary:  Negative for dysuria.  Musculoskeletal:  Negative for neck pain.  Skin:  Negative for  wound.  Neurological:  Negative for headaches.   Physical Exam Updated Vital Signs BP 136/76 (BP Location: Right Arm)   Pulse 86   Temp 97.7 F (36.5 C) (Oral)   Resp 18   Ht 5\' 6"  (1.676 m)   Wt 74.8 kg   SpO2 100%   BMI 26.63 kg/m   Physical Exam Vitals and nursing note reviewed.  Constitutional:      General: He is not in acute distress.    Appearance: Normal appearance. He is well-developed.  HENT:     Head: Normocephalic and atraumatic.     Right Ear: Tympanic membrane normal.     Left Ear: Tympanic membrane normal.     Nose: Nose normal.     Mouth/Throat:     Mouth: Mucous membranes are moist.     Pharynx: Oropharynx is clear.  Eyes:     Extraocular Movements: Extraocular movements intact.     Conjunctiva/sclera: Conjunctivae normal.     Pupils: Pupils are equal, round, and reactive to light.     Comments: He has swelling of upper and lower lids and some swelling over his right maxilla.  Cardiovascular:     Rate and Rhythm: Normal rate and regular rhythm.     Heart sounds: No murmur heard. Pulmonary:     Effort: Pulmonary effort is normal. No respiratory distress.     Breath sounds: Normal breath sounds.  Abdominal:     Palpations: Abdomen is soft.     Tenderness: There is no abdominal tenderness.  Musculoskeletal:        General: No swelling or deformity. Normal range of motion.     Cervical back: Neck supple.  Skin:    General: Skin is warm and dry.     Capillary Refill: Capillary refill takes less than 2 seconds.  Neurological:     General: No focal deficit present.     Mental Status: He is alert.  Psychiatric:        Mood and Affect: Mood normal.     ED Results / Procedures / Treatments   Labs (all labs ordered are listed, but only abnormal results are displayed) Labs Reviewed  BASIC METABOLIC PANEL  CBC WITH DIFFERENTIAL/PLATELET    EKG None  Radiology CT Maxillofacial W Contrast  Result Date: 02/15/2021 CLINICAL DATA:   Maxillary/facial abscess swelling aroung right eye, cheek EXAM: CT MAXILLOFACIAL WITH CONTRAST TECHNIQUE: Multidetector CT imaging of the maxillofacial structures was performed with intravenous contrast. Multiplanar CT image reconstructions were also generated. CONTRAST:  49mL OMNIPAQUE IOHEXOL 300 MG/ML  SOLN COMPARISON:  None. FINDINGS: Osseous: No fracture or mandibular dislocation. No destructive process. Orbits: No retro bulbar edema. Unremarkable orbits, extraocular muscles, optic nerves, and lacrimal glands. Right preseptal edema, described below. Sinuses: Small left maxillary sinus retention cyst. Otherwise, largely clear  sinuses. 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 Soft tissues: Subtle/mild right preseptal/periorbital soft tissue stranding/edema. No discrete, drainable fluid collection. No evidence of postseptal extension. Limited intracranial: No significant or unexpected finding. IMPRESSION: 1. Subtle/mild right preseptal/periorbital soft tissue stranding/edema, potentially infectious or inflammatory. No discrete, drainable fluid collection. No evidence of postseptal extension. 2. 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 Electronically Signed   By: Feliberto Harts M.D.   On: 02/15/2021 11:37    Procedures Procedures   Medications Ordered in ED Medications - No data to display  ED Course  I have reviewed the triage vital signs and the nursing notes.  Pertinent labs & imaging results that were available during my care of the patient were reviewed by me and considered in my medical decision making (see chart for details).  Clinical Course as of 02/15/21 1732  Tue Feb 15, 2021  1148 CT showing some subtle preseptal inflammation with no drainable abscess.  Will cover with antibiotics.  [MB]    Clinical Course User Index [MB] Terrilee Files, MD   MDM Rules/Calculators/A&P                           This patient complains of facial swelling; this involves an extensive  number of treatment Options and is a complaint that carries with it a high risk of complications and Morbidity. The differential includes facial cellulitis, preseptal cellulitis, orbital cellulitis, dental infection, abscess parotitis  I ordered, reviewed and interpreted labs, which included CBC with normal white count normal hemoglobin, chemistries normal  I ordered imaging studies which included CT maxillofacial with contrast and I independently    visualized and interpreted imaging which showed preorbital soft tissue swelling with no discrete abscess identified  After the interventions stated above, I reevaluated the patient and found patient be nontoxic-appearing.  Reviewed results of work-up with him.  Will cover with prescription for Augmentin and recommended close follow-up with treating providers.  Return instructions discussed.  Final Clinical Impression(s) / ED Diagnoses Final diagnoses:  Preseptal cellulitis of right eye    Rx / DC Orders ED Discharge Orders          Ordered    amoxicillin-clavulanate (AUGMENTIN) 875-125 MG tablet  Every 12 hours        02/15/21 1150             Terrilee Files, MD 02/15/21 1734

## 2021-02-15 NOTE — ED Triage Notes (Signed)
Pt noted itching and slight swelling to right side of face on Sunday. Went to UC yesterday, given eye drops/abx. Swelling noted around right eye, under eye. Denies vision changes.

## 2021-02-15 NOTE — Discharge Instructions (Signed)
You were seen in the emergency department for evaluation of swelling around her right eye and cheek.  Your lab work was unremarkable.  Your CAT scan showed some inflammation around the eye.  This is likely an infection that should respond to antibiotics.  It is also possible that this is an allergic reaction and you can try some Benadryl.  Follow-up with your primary care doctor and return to the emergency department if any worsening or concerning symptoms

## 2021-03-28 ENCOUNTER — Ambulatory Visit: Payer: BC Managed Care – PPO | Admitting: Family Medicine

## 2021-05-04 IMAGING — DX DG FINGER MIDDLE 2+V*R*
3 series · 3 of 3 positions shown · non-contrast
Comparison: 04/13/2010

CLINICAL DATA: Right middle finger laceration with increasing
swelling and redness

EXAM:
RIGHT MIDDLE FINGER 2+V

[finger ap]
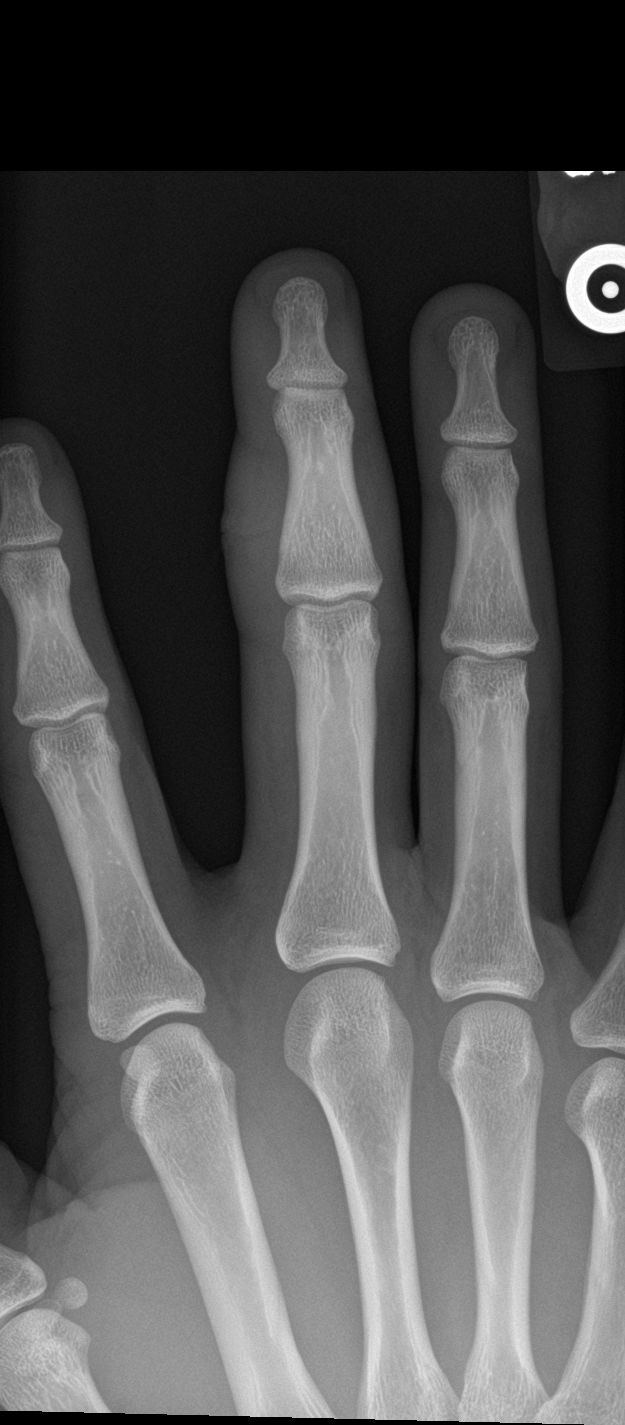

[finger obl]
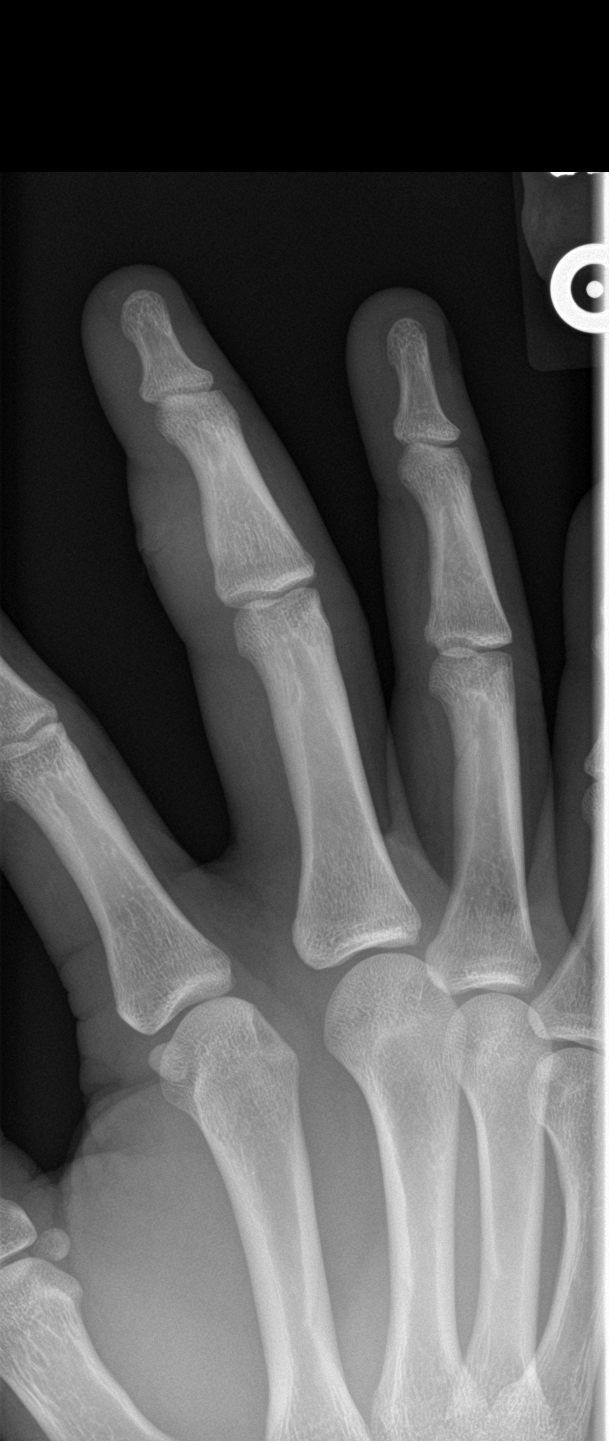

[finger lat]
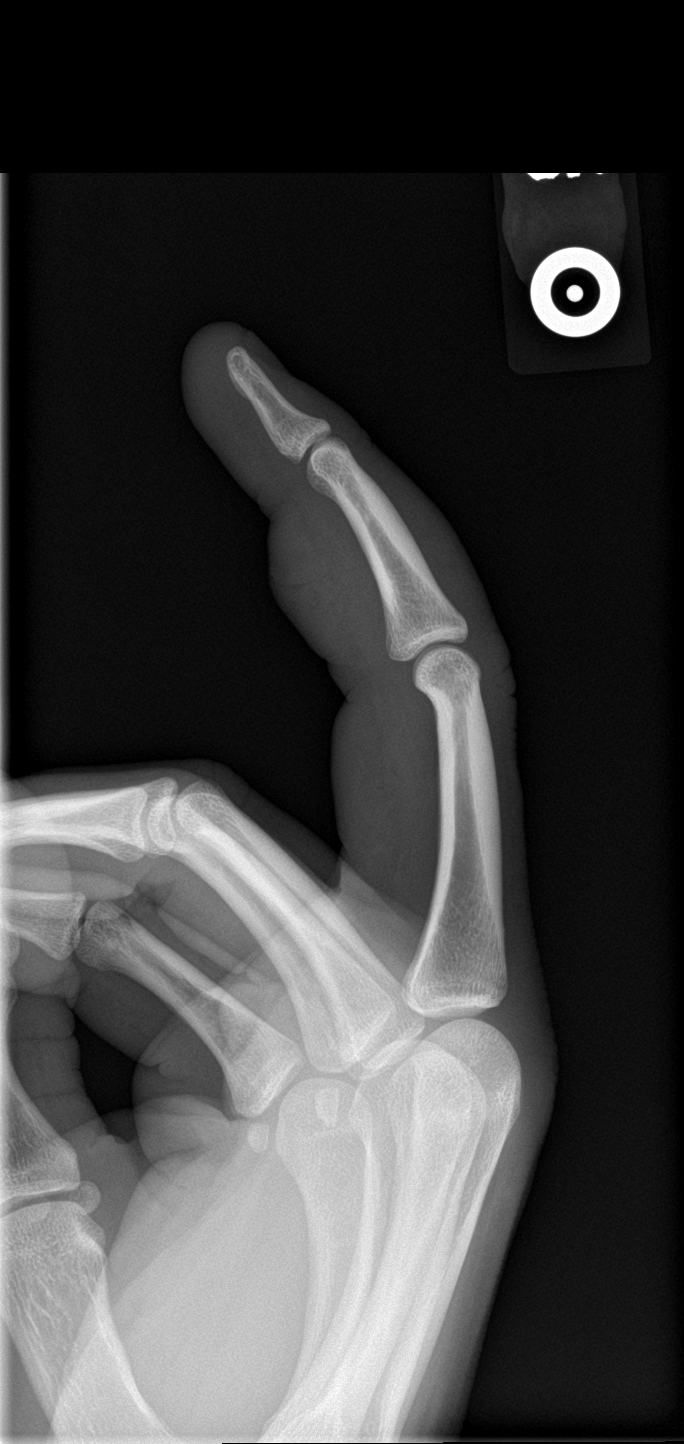

[3 of 3 positions shown; findings below may reference images not displayed]

FINDINGS: No acute fracture. No dislocation. No cortical destruction or
periostitis. There is prominent soft tissue swelling of the right
long finger most pronounced at the palmar and radial aspects of the
long finger middle phalanx. No soft tissue gas or radiopaque foreign
body.
IMPRESSION: 1. No acute osseous abnormality.
2. Prominent soft tissue swelling of the long finger. No soft tissue
gas or radiopaque foreign body.

## 2021-09-13 ENCOUNTER — Other Ambulatory Visit: Payer: Self-pay

## 2021-09-13 ENCOUNTER — Emergency Department (HOSPITAL_BASED_OUTPATIENT_CLINIC_OR_DEPARTMENT_OTHER)
Admission: EM | Admit: 2021-09-13 | Discharge: 2021-09-13 | Disposition: A | Discharge status: Home / Self Care | Payer: BC Managed Care – PPO | Attending: Emergency Medicine | Admitting: Emergency Medicine

## 2021-09-13 DIAGNOSIS — R1110 Vomiting, unspecified: Secondary | ICD-10-CM | POA: Diagnosis not present

## 2021-09-13 DIAGNOSIS — R112 Nausea with vomiting, unspecified: Secondary | ICD-10-CM

## 2021-09-13 DIAGNOSIS — L559 Sunburn, unspecified: Secondary | ICD-10-CM | POA: Insufficient documentation

## 2021-09-13 LAB — URINALYSIS, ROUTINE W REFLEX MICROSCOPIC
Bilirubin Urine: NEGATIVE
Glucose, UA: NEGATIVE mg/dL
Hgb urine dipstick: NEGATIVE
Ketones, ur: NEGATIVE mg/dL
Leukocytes,Ua: NEGATIVE
Nitrite: NEGATIVE
Protein, ur: NEGATIVE mg/dL
Specific Gravity, Urine: 1.01 (ref 1.005–1.030)
pH: 7 (ref 5.0–8.0)

## 2021-09-13 LAB — CBC WITH DIFFERENTIAL/PLATELET
Abs Immature Granulocytes: 0.01 10*3/uL (ref 0.00–0.07)
Basophils Absolute: 0 10*3/uL (ref 0.0–0.1)
Basophils Relative: 0 %
Eosinophils Absolute: 0.2 10*3/uL (ref 0.0–0.5)
Eosinophils Relative: 2 %
HCT: 38.8 % — ABNORMAL LOW (ref 39.0–52.0)
Hemoglobin: 13.3 g/dL (ref 13.0–17.0)
Immature Granulocytes: 0 %
Lymphocytes Relative: 28 %
Lymphs Abs: 2.1 10*3/uL (ref 0.7–4.0)
MCH: 30.1 pg (ref 26.0–34.0)
MCHC: 34.3 g/dL (ref 30.0–36.0)
MCV: 87.8 fL (ref 80.0–100.0)
Monocytes Absolute: 0.7 10*3/uL (ref 0.1–1.0)
Monocytes Relative: 9 %
Neutro Abs: 4.5 10*3/uL (ref 1.7–7.7)
Neutrophils Relative %: 61 %
Platelets: 211 10*3/uL (ref 150–400)
RBC: 4.42 MIL/uL (ref 4.22–5.81)
RDW: 11.9 % (ref 11.5–15.5)
WBC: 7.5 10*3/uL (ref 4.0–10.5)
nRBC: 0 % (ref 0.0–0.2)

## 2021-09-13 LAB — COMPREHENSIVE METABOLIC PANEL
ALT: 16 U/L (ref 0–44)
AST: 18 U/L (ref 15–41)
Albumin: 4.2 g/dL (ref 3.5–5.0)
Alkaline Phosphatase: 72 U/L (ref 38–126)
Anion gap: 7 (ref 5–15)
BUN: 11 mg/dL (ref 6–20)
CO2: 26 mmol/L (ref 22–32)
Calcium: 9.1 mg/dL (ref 8.9–10.3)
Chloride: 104 mmol/L (ref 98–111)
Creatinine, Ser: 1 mg/dL (ref 0.61–1.24)
GFR, Estimated: >60 mL/min (ref >60)
Glucose, Bld: 91 mg/dL (ref 70–99)
Potassium: 3.7 mmol/L (ref 3.5–5.1)
Sodium: 137 mmol/L (ref 135–145)
Total Bilirubin: 0.8 mg/dL (ref 0.3–1.2)
Total Protein: 7.4 g/dL (ref 6.5–8.1)

## 2021-09-13 LAB — CK: Total CK: 93 U/L (ref 49–397)

## 2021-09-13 MED ORDER — ONDANSETRON HCL 4 MG PO TABS: 4.0000 mg | ORAL_TABLET | Freq: Four times a day (QID) | ORAL | 0 refills | Status: DC

## 2021-09-13 MED ORDER — LACTATED RINGERS IV BOLUS
1000.0000 mL | Freq: Once | INTRAVENOUS | Status: AC
  Administered 2021-09-13: 1000 mL via INTRAVENOUS

## 2021-09-13 MED ORDER — ONDANSETRON HCL 4 MG/2ML IJ SOLN
4.0000 mg | Freq: Once | INTRAMUSCULAR | Status: AC
  Administered 2021-09-13: 4 mg via INTRAVENOUS
  Filled 2021-09-13: qty 2

## 2021-09-13 NOTE — Discharge Instructions (Signed)
You have a sunburn and you may apply Neosporin as needed  Please stay hydrated.  Take Zofran for nausea  See your doctor for follow-up  Return to ER if you have worse vomiting, abdominal pain, dehydration

## 2021-09-13 NOTE — ED Triage Notes (Signed)
Patient c/o n/v with sunburn and n/v after being in the sun yesterday - Patient believes it is being outside too long.  Vomited x 1 today

## 2021-09-13 NOTE — ED Provider Notes (Signed)
[facility] EMERGENCY DEPARTMENT Provider Note   CSN: [id number] Arrival date & time: 09/13/21  1752     History  Chief Complaint  Patient presents with   Emesis   Sunburn    Kevin Bryan is a 26 y.o. male otherwise healthy here presenting with sunburn and vomiting.  He states that he was out in the sun yesterday for most of the day.  He states that he woke up today and noticed that he got a sunburn on his torso and back.  He states that he just felt very dehydrated.  He states that he was unable to keep anything down today.  Denies any abdominal pain.  The history is provided by the patient.       Home Medications Prior to Admission medications   Medication Sig Start Date End Date Taking? Authorizing Provider  albuterol (VENTOLIN HFA) 108 (90 Base) MCG/ACT inhaler Inhale 2 puffs into the lungs every 6 (six) hours as needed for wheezing or shortness of breath. 11/20/18   Sharlene Dory, DO  amoxicillin-clavulanate (AUGMENTIN) 875-125 MG tablet Take 1 tablet by mouth every 12 (twelve) hours. 02/15/21   Terrilee Files, MD      Allergies    Bee venom and Food    Review of Systems   Review of Systems  Gastrointestinal:  Positive for vomiting.  All other systems reviewed and are negative.   Physical Exam Updated Vital Signs BP 128/64 (BP Location: Right Arm)   Pulse 84   Temp 98.6 F (37 C) (Oral)   Resp 16   Ht 5\' 6"  (1.676 m)   Wt 72.6 kg   SpO2 100%   BMI 25.82 kg/m  Physical Exam Vitals and nursing note reviewed.  Constitutional:      Comments: Dehydrated  HENT:     Head: Normocephalic.     Nose: Nose normal.     Mouth/Throat:     Mouth: Mucous membranes are dry.  Eyes:     Extraocular Movements: Extraocular movements intact.     Pupils: Pupils are equal, round, and reactive to light.  Cardiovascular:     Rate and Rhythm: Normal rate and regular rhythm.     Pulses: Normal pulses.     Heart sounds: Normal heart sounds.  Pulmonary:      Effort: Pulmonary effort is normal.     Breath sounds: Normal breath sounds.  Abdominal:     General: Abdomen is flat.     Palpations: Abdomen is soft.  Musculoskeletal:        General: Normal range of motion.     Cervical back: Normal range of motion and neck supple.  Skin:    Capillary Refill: Capillary refill takes less than 2 seconds.     Comments: Patient has some burn of his torso and back with some blisters  Neurological:     General: No focal deficit present.     Mental Status: He is oriented to person, place, and time.  Psychiatric:        Mood and Affect: Mood normal.        Behavior: Behavior normal.     ED Results / Procedures / Treatments   Labs (all labs ordered are listed, but only abnormal results are displayed) Labs Reviewed  CBC WITH DIFFERENTIAL/PLATELET - Abnormal; Notable for the following components:      Result Value   HCT 38.8 (*)    All other components within normal limits  COMPREHENSIVE METABOLIC PANEL  CK  URINALYSIS, ROUTINE W REFLEX MICROSCOPIC    EKG None  Radiology No results found.  Procedures Procedures    Medications Ordered in ED Medications  lactated ringers bolus 1,000 mL (1,000 mLs Intravenous New Bag/Given 09/13/21 1855)  ondansetron (ZOFRAN) injection 4 mg (4 mg Intravenous Given 09/13/21 1945)    ED Course/ Medical Decision Making/ A&P                           Medical Decision Making Kevin Bryan is a 26 y.o. male here presenting with sunburn and vomiting.  I think he may have some heat exhaustion.  Also appears dehydrated. Consider rhabdomyolysis as well.  Plan to get CBC and CMP and CK level.  We will hydrate and reassess  8:25 PM I reviewed patient's labs.  CBC and CMP and CK level are unremarkable.  Given IV fluids and patient is able to tolerate p.o.  Will discharge home with Zofran as needed  Amount and/or Complexity of Data Reviewed Labs: ordered. Decision-making details documented in ED  Course.  Risk Prescription drug management.    Final Clinical Impression(s) / ED Diagnoses Final diagnoses:  None    Rx / DC Orders ED Discharge Orders     None         Charlynne Pander, MD 09/13/21 2026

## 2021-09-13 NOTE — ED Notes (Signed)
Patient provided with water for PO challenge.

## 2021-12-09 ENCOUNTER — Encounter (HOSPITAL_BASED_OUTPATIENT_CLINIC_OR_DEPARTMENT_OTHER): Payer: Self-pay

## 2021-12-09 ENCOUNTER — Emergency Department (HOSPITAL_BASED_OUTPATIENT_CLINIC_OR_DEPARTMENT_OTHER): Payer: BC Managed Care – PPO

## 2021-12-09 ENCOUNTER — Emergency Department (HOSPITAL_BASED_OUTPATIENT_CLINIC_OR_DEPARTMENT_OTHER)
Admission: EM | Admit: 2021-12-09 | Discharge: 2021-12-09 | Disposition: A | Discharge status: Home / Self Care | Payer: BC Managed Care – PPO | Attending: Emergency Medicine | Admitting: Emergency Medicine

## 2021-12-09 ENCOUNTER — Other Ambulatory Visit: Payer: Self-pay

## 2021-12-09 DIAGNOSIS — N451 Epididymitis: Secondary | ICD-10-CM | POA: Diagnosis not present

## 2021-12-09 DIAGNOSIS — N50811 Right testicular pain: Secondary | ICD-10-CM | POA: Diagnosis present

## 2021-12-09 LAB — URINALYSIS, ROUTINE W REFLEX MICROSCOPIC
Bilirubin Urine: NEGATIVE
Glucose, UA: NEGATIVE mg/dL
Hgb urine dipstick: NEGATIVE
Ketones, ur: NEGATIVE mg/dL
Leukocytes,Ua: NEGATIVE
Nitrite: NEGATIVE
Protein, ur: NEGATIVE mg/dL
Specific Gravity, Urine: 1.01 (ref 1.005–1.030)
pH: 7 (ref 5.0–8.0)

## 2021-12-09 MED ORDER — DOXYCYCLINE HYCLATE 100 MG PO CAPS: 100.0000 mg | ORAL_CAPSULE | Freq: Two times a day (BID) | ORAL | 0 refills | Status: AC

## 2021-12-09 MED ORDER — LIDOCAINE HCL (PF) 1 % IJ SOLN
1.0000 mL | Freq: Once | INTRAMUSCULAR | Status: AC
  Administered 2021-12-09: 1 mL
  Filled 2021-12-09: qty 5

## 2021-12-09 MED ORDER — CEFTRIAXONE SODIUM 500 MG IJ SOLR
500.0000 mg | Freq: Once | INTRAMUSCULAR | Status: AC
  Administered 2021-12-09: 500 mg via INTRAMUSCULAR
  Filled 2021-12-09: qty 500

## 2021-12-09 NOTE — ED Triage Notes (Signed)
Pt states that he has been having R testicle pain since about 2am, pain started suddenly, swelling as well

## 2021-12-09 NOTE — Discharge Instructions (Signed)
You were diagnosed today with epididymitis.  Please take the prescribed antibiotics.  This should clear up over the next few days.  Please follow-up as needed with your primary care provider

## 2021-12-09 NOTE — ED Provider Notes (Signed)
[facility] EMERGENCY DEPARTMENT Provider Note   CSN: [id number] Arrival date & time: 12/09/21  1908     History  Chief Complaint  Patient presents with   Testicle Pain    Kevin Bryan is a 26 y.o. male.  Patient presents to the hospital complaining of right-sided testicular pain which began earlier this morning.  Patient states that he got in his truck for work and felt some tenderness in the right testicular region.  He denies any urinary symptoms, penile discharge, injury to the testicles.  Patient denies fever, nausea, vomiting.  Patient has history of chlamydia infection.  HPI     Home Medications Prior to Admission medications   Medication Sig Start Date End Date Taking? Authorizing Provider  doxycycline (VIBRAMYCIN) 100 MG capsule Take 1 capsule (100 mg total) by mouth 2 (two) times daily. 12/09/21  Yes Dorothyann Peng, PA-C  albuterol (VENTOLIN HFA) 108 (90 Base) MCG/ACT inhaler Inhale 2 puffs into the lungs every 6 (six) hours as needed for wheezing or shortness of breath. 11/20/18   Shelda Pal, DO  amoxicillin-clavulanate (AUGMENTIN) 875-125 MG tablet Take 1 tablet by mouth every 12 (twelve) hours. 02/15/21   Hayden Rasmussen, MD  ondansetron (ZOFRAN) 4 MG tablet Take 1 tablet (4 mg total) by mouth every 6 (six) hours. 09/13/21   Drenda Freeze, MD      Allergies    Bee venom and Food    Review of Systems   Review of Systems  Genitourinary:  Positive for testicular pain. Negative for penile discharge and penile pain.    Physical Exam Updated Vital Signs BP (!) 129/97 (BP Location: Right Arm)   Pulse 70   Temp 98.5 F (36.9 C) (Oral)   Resp 16   SpO2 97%  Physical Exam Vitals and nursing note reviewed. Exam conducted with a chaperone present.  HENT:     Head: Normocephalic and atraumatic.  Eyes:     Pupils: Pupils are equal, round, and reactive to light.  Pulmonary:     Effort: Pulmonary effort is normal. No respiratory distress.   Genitourinary:    Epididymis:     Right: Enlarged. Tenderness present.     Left: Normal.  Musculoskeletal:        General: No signs of injury.     Cervical back: Normal range of motion.  Skin:    General: Skin is dry.  Neurological:     Mental Status: He is alert.  Psychiatric:        Speech: Speech normal.        Behavior: Behavior normal.     ED Results / Procedures / Treatments   Labs (all labs ordered are listed, but only abnormal results are displayed) Labs Reviewed  URINALYSIS, ROUTINE W REFLEX MICROSCOPIC  GC/CHLAMYDIA PROBE AMP ([place]) NOT AT [facility]    EKG None  Radiology US SCROTUM W/DOPPLER  Result Date: 12/09/2021 CLINICAL DATA:  Right-sided testicular pain times 16 hours. EXAM: SCROTAL ULTRASOUND DOPPLER ULTRASOUND OF THE TESTICLES TECHNIQUE: Complete ultrasound examination of the testicles, epididymis, and other scrotal structures was performed. Color and spectral Doppler ultrasound were also utilized to evaluate blood flow to the testicles. COMPARISON:  None Available. FINDINGS: Right testicle Measurements: 2.1 cm x 3.8 cm x 2.2 cm. No mass or microlithiasis visualized. Left testicle Measurements: 4.0 cm x 1.8 cm x 2.1 cm. No mass or microlithiasis visualized. Right epididymis: The right epididymis is enlarged and demonstrates increased flow on color Doppler  evaluation. Left epididymis:  Normal in size and appearance. Hydrocele:  None visualized. Varicocele:  None visualized. Pulsed Doppler interrogation of both testes demonstrates normal low resistance arterial and venous waveforms bilaterally. IMPRESSION: Findings consistent with right-sided epididymitis. Electronically Signed   By: Virgina Norfolk M.D.   On: 12/09/2021 20:13    Procedures Procedures    Medications Ordered in ED Medications  cefTRIAXone (ROCEPHIN) injection 500 mg (500 mg Intramuscular Given 12/09/21 2108)  lidocaine (PF) (XYLOCAINE) 1 % injection 1-2.1 mL (1 mL Other Given 12/09/21 2108)     ED Course/ Medical Decision Making/ A&P                           Medical Decision Making Amount and/or Complexity of Data Reviewed Labs: ordered. Radiology: ordered.   Patient presents with a chief complaint of right-sided testicular pain.  Differential diagnosis includes but is not limited to testicular torsion, epididymitis, injury, and others  I viewed the patient's past medical history and did see a reported history of previous chlamydial infection and genital herpes infection  I ordered lab work including urinalysis and gonorrhea chlamydia probe.  Urinalysis unremarkable.  GC chlamydia probe is pending.  I ordered imaging including ultrasound to rule out torsion.  Findings consistent with right-sided epididymitis.  No signs of torsion.  I agree with radiologist findings.  Over the patient antibiotics including a Rocephin injection.  He tolerated the injection well.  The patient may discharge home at this time.  No signs of torsion.  No known injury.  The history and physical combined ultrasound are consistent with epididymitis.  Doxycycline prescription provided.  Patient may follow-up as needed for results of the GC chlamydia probe on MyChart.        Final Clinical Impression(s) / ED Diagnoses Final diagnoses:  Epididymitis, right    Rx / DC Orders ED Discharge Orders          Ordered    doxycycline (VIBRAMYCIN) 100 MG capsule  2 times daily        12/09/21 2046              Ronny Bacon 12/09/21 2127    Drenda Freeze, MD 12/09/21 [phone]

## 2021-12-12 LAB — GC/CHLAMYDIA PROBE AMP (~~LOC~~) NOT AT ARMC
Chlamydia: NEGATIVE
Comment: NEGATIVE
Comment: NORMAL
Neisseria Gonorrhea: NEGATIVE

## 2022-02-13 ENCOUNTER — Encounter: Payer: Self-pay | Admitting: Family Medicine

## 2022-02-13 ENCOUNTER — Ambulatory Visit: Payer: BC Managed Care – PPO | Admitting: Family Medicine

## 2022-02-13 VITALS — BP 121/79 | HR 90 | Temp 98.1°F | Ht 66.0 in | Wt 171.1 lb

## 2022-02-13 DIAGNOSIS — M25562 Pain in left knee: Secondary | ICD-10-CM | POA: Diagnosis not present

## 2022-02-13 DIAGNOSIS — M25561 Pain in right knee: Secondary | ICD-10-CM | POA: Diagnosis not present

## 2022-02-13 NOTE — Patient Instructions (Addendum)
Ice/cold pack over area for 10-15 min twice daily.  Heat (pad or rice pillow in microwave) over affected area, 10-15 minutes twice daily.   OK to take Tylenol 1000 mg (2 extra strength tabs) or 975 mg (3 regular strength tabs) every 6 hours as needed.  Ibuprofen 400-600 mg (2-3 over the counter strength tabs) every 6 hours as needed for pain.  Send me a message 1 month if no better.   Let us know if you need anything.  Knee Exercises It is normal to feel mild stretching, pulling, tightness, or discomfort as you do these exercises, but you should stop right away if you feel sudden pain or your pain gets worse.  STRETCHING AND RANGE OF MOTION EXERCISES  These exercises warm up your muscles and joints and improve the movement and flexibility of your knee. These exercises also help to relieve pain, numbness, and tingling. Exercise A: Knee Extension, Prone  Lie on your abdomen on a bed. Place your left / right knee just beyond the edge of the surface so your knee is not on the bed. You can put a towel under your left / right thigh just above your knee for comfort. Relax your leg muscles and allow gravity to straighten your knee. You should feel a stretch behind your left / right knee. Hold this position for 30 seconds. Scoot up so your knee is supported between repetitions. Repeat 2 times. Complete this stretch 3 times per week. Exercise B: Knee Flexion, Active     Lie on your back with both knees straight. If this causes back discomfort, bend your left / right knee so your foot is flat on the floor. Slowly slide your left / right heel back toward your buttocks until you feel a gentle stretch in the front of your knee or thigh. Hold this position for 30 seconds. Slowly slide your left / right heel back to the starting position. Repeat 2 times. Complete this exercise 3 times per week. Exercise C: Quadriceps, Prone     Lie on your abdomen on a firm surface, such as a bed or padded  floor. Bend your left / right knee and hold your ankle. If you cannot reach your ankle or pant leg, loop a belt around your foot and grab the belt instead. Gently pull your heel toward your buttocks. Your knee should not slide out to the side. You should feel a stretch in the front of your thigh and knee. Hold this position for 30 seconds. Repeat 2 times. Complete this stretch 3 times per week. Exercise D: Hamstring, Supine  Lie on your back. Loop a belt or towel over the ball of your left / right foot. The ball of your foot is on the walking surface, right under your toes. Straighten your left / right knee and slowly pull on the belt to raise your leg until you feel a gentle stretch behind your knee. Do not let your left / right knee bend while you do this. Keep your other leg flat on the floor. Hold this position for 30 seconds. Repeat 2 times. Complete this stretch 3 times per week. STRENGTHENING EXERCISES  These exercises build strength and endurance in your knee. Endurance is the ability to use your muscles for a long time, even after they get tired. Exercise E: Quadriceps, Isometric     Lie on your back with your left / right leg extended and your other knee bent. Put a rolled towel or small pillow under  your knee if told by your health care provider. Slowly tense the muscles in the front of your left / right thigh. You should see your kneecap slide up toward your hip or see increased dimpling just above the knee. This motion will push the back of the knee toward the floor. For 3 seconds, keep the muscle as tight as you can without increasing your pain. Relax the muscles slowly and completely. Repeat for 10 total reps Repeat 2 ti mes. Complete this exercise 3 times per week. Exercise F: Straight Leg Raises - Quadriceps  Lie on your back with your left / right leg extended and your other knee bent. Tense the muscles in the front of your left / right thigh. You should see your kneecap  slide up or see increased dimpling just above the knee. Your thigh may even shake a bit. Keep these muscles tight as you raise your leg 4-6 inches (10-15 cm) off the floor. Do not let your knee bend. Hold this position for 3 seconds. Keep these muscles tense as you lower your leg. Relax your muscles slowly and completely after each repetition. 10 total reps. Repeat 2 times. Complete this exercise 3 times per week.  Exercise G: Hamstring Curls     If told by your health care provider, do this exercise while wearing ankle weights. Begin with 5 lb weights (optional). Then increase the weight by 1 lb (0.5 kg) increments. Do not wear ankle weights that are more than 20 lbs to start with. Lie on your abdomen with your legs straight. Bend your left / right knee as far as you can without feeling pain. Keep your hips flat against the floor. Hold this position for 3 seconds. Slowly lower your leg to the starting position. Repeat for 10 reps.  Repeat 2 times. Complete this exercise 3 times per week. Exercise H: Squats (Quadriceps)  Stand in front of a table, with your feet and knees pointing straight ahead. You may rest your hands on the table for balance but not for support. Slowly bend your knees and lower your hips like you are going to sit in a chair. Keep your weight over your heels, not over your toes. Keep your lower legs upright so they are parallel with the table legs. Do not let your hips go lower than your knees. Do not bend lower than told by your health care provider. If your knee pain increases, do not bend as low. Hold the squat position for 1 second. Slowly push with your legs to return to standing. Do not use your hands to pull yourself to standing. Repeat 2 times. Complete this exercise 3 times per week. Exercise I: Wall Slides (Quadriceps)     Lean your back against a smooth wall or door while you walk your feet out 18-24 inches (46-61 cm) from it. Place your feet hip-width  apart. Slowly slide down the wall or door until your knees Repeat 2 times. Complete this exercise every other day. Exercise K: Straight Leg Raises - Hip Abductors  Lie on your side with your left / right leg in the top position. Lie so your head, shoulder, knee, and hip line up. You may bend your bottom knee to help you keep your balance. Roll your hips slightly forward so your hips are stacked directly over each other and your left / right knee is facing forward. Leading with your heel, lift your top leg 4-6 inches (10-15 cm). You should feel the muscles in your  outer hip lifting. Do not let your foot drift forward. Do not let your knee roll toward the ceiling. Hold this position for 3 seconds. Slowly return your leg to the starting position. Let your muscles relax completely after each repetition. 10 total reps. Repeat 2 times. Complete this exercise 3 times per week. Exercise J: Straight Leg Raises - Hip Extensors  Lie on your abdomen on a firm surface. You can put a pillow under your hips if that is more comfortable. Tense the muscles in your buttocks and lift your left / right leg about 4-6 inches (10-15 cm). Keep your knee straight as you lift your leg. Hold this position for 3 seconds. Slowly lower your leg to the starting position. Let your leg relax completely after each repetition. Repeat 2 times. Complete this exercise 3 times per week. Document Released: 01/11/2005 Document Revised: 11/22/2015 Document Reviewed: 01/03/2015 Elsevier Interactive Patient Education  2017 [employer]  Semimembranosus Tendinitis Rehab  It is normal to feel mild stretching, pulling, tightness, or discomfort as you do these exercises, but you should stop right away if you feel sudden pain or your pain gets worse.  Stretching and range of motion exercises These exercises warm up your muscles and joints and improve the movement and flexibility of your thigh. These exercises also help to relieve pain,  numbness, and tingling. Exercise A: Hamstring stretch, supine    Lie on your back. Loop a belt or towel across the ball of your left / right foot The ball of your foot is on the walking surface, right under your toes. Straighten your left / right knee and slowly pull on the belt to raise your leg. Stop when you feel a gentle stretch behind your left / right knee or thigh. Do not allow the knee to bend. Keep your other leg flat on the floor. Hold this position for 30 seconds. Repeat 2 times. Complete this exercise 3 times a week. Strengthening exercises These exercises build strength and endurance in your thigh. Endurance is the ability to use your muscles for a long time, even after they get tired. Exercise B: Straight leg raises (hip extensors) Lie on your belly on a bed or a firm surface with a pillow under your hips. Bend your left / right knee so your foot is straight up in the air. Squeeze your buttock muscles and lift your left / right thigh off the bed. Do not let your back arch. Hold this position for 3 seconds. Slowly return to the starting position. Let your muscles relax completely before you do another repetition. Repeat 2 times. Complete this exercise 3 times a week. Exercise C: Bridge (hip extensors)     Lie on your back on a firm surface with your knees bent and your feet flat on the floor. Tighten your buttocks muscles and lift your bottom off the floor until your trunk is level with your thighs. You should feel the muscles working in your buttocks and the back of your thighs. If you do not feel these muscles, slide your feet 1-2 inches (2.5-5 cm) farther away from your buttocks. Do not arch your back. Hold this position for 3 seconds. Slowly lower your hips to the starting position. Let your buttocks muscles relax completely between repetitions. If this exercise is too easy, try doing it with your arms crossed over your chest. Repeat 2 times. Complete this exercise 3  times a week. Exercise D: Hamstring eccentric, prone Lie on your belly on a bed  or on the floor. Start with your legs straight. Cross your legs at the ankles with your left / right leg on top. Using your bottom leg to do the work, bend both knees. Using just your left / right leg alone, slowly lower your leg back down toward the bed. Add a 5 lb weight as told by your health care provider. Let your muscles relax completely between repetitions. Repeat 2 times. Complete this exercise 3 times a week. Exercise E: Squats Stand in front of a table, with your feet and knees pointing straight ahead. You may rest your hands on the table for balance but not for support. Slowly bend your knees and lower your hips like you are going to sit in a chair. Keep your thighs straight or pointed slightly outward. Keep your weight over your heels, not over your toes. Keep your lower legs upright so they are parallel with the table legs. Do not let your hips go lower than your knees. Stop when your knees are bent to the shape of an upside-down letter "L" (90 degree angle). Do not bend lower than told by your health care provider. If your knee pain increases, do not bend as low. Hold the squat position 1-2 seconds. Slowly push with your legs to return to standing. Do not use your hands to pull yourself to standing. Repeat 2 times. Complete this exercise 3 times a week. Make sure you discuss any questions you have with your health care provider. Document Released: 02/27/2005 Document Revised: 11/04/2015 Document Reviewed: 12/01/2014 Elsevier Interactive Patient Education  [employer].

## 2022-02-13 NOTE — Progress Notes (Signed)
Musculoskeletal Exam  Patient: Kevin Bryan DOB: 1995/08/11  DOS: 02/13/2022  SUBJECTIVE:  Chief Complaint:   Chief Complaint  Patient presents with   Knee Pain    Left     Kevin Bryan is a 26 y.o.  male for evaluation and treatment of b/l knee pain.   Onset:  3 weeks ago. No inj or change in activity.  Location: inside and outside Character:  dull  Hurts on stairs or crouching down. Progression of issue:  is unchanged Associated symptoms: limping No bruising, swelling, redness, catching/locking.  Treatment: to date has been: none.   Neurovascular symptoms: no  Past Medical History:  Diagnosis Date   Asthma    Hypospadias    Nausea and vomiting    Varicella     Objective: VITAL SIGNS: BP 121/79 (BP Location: Left Arm, Patient Position: Sitting, Cuff Size: Normal)   Pulse 90   Temp 98.1 F (36.7 C) (Oral)   Ht 5\' 6"  (1.676 m)   Wt 171 lb 2 oz (77.6 kg)   SpO2 98%   BMI 27.62 kg/m  Constitutional: Well formed, well developed. No acute distress. Thorax & Lungs: No accessory muscle use Musculoskeletal: knees.   Normal active range of motion: yes.   Normal passive range of motion: yes Tenderness to palpation: slight tp over medial distal hamstring Deformity: no Ecchymosis: no Tests positive: none Tests negative: McMurray's, varus/valgus stress, patellar apprehension/grind, Lachman's Neurologic: Normal sensory function. Nml gait. Psychiatric: Normal mood. Age appropriate judgment and insight. Alert & oriented x 3.    Assessment:  Acute pain of both knees  Plan: Stretches/exercises, heat, ice, Tylenol. PT if no better in 1 mo. F/u prn. The patient voiced understanding and agreement to the plan.   [place], DO 02/13/22  3:42 PM

## 2022-02-15 ENCOUNTER — Encounter: Payer: BC Managed Care – PPO | Admitting: Family Medicine

## 2022-12-19 ENCOUNTER — Encounter: Payer: Self-pay | Admitting: Family Medicine

## 2022-12-19 ENCOUNTER — Encounter: Payer: BC Managed Care – PPO | Admitting: Family Medicine

## 2023-03-16 DIAGNOSIS — R1110 Vomiting, unspecified: Secondary | ICD-10-CM | POA: Diagnosis not present

## 2023-03-16 DIAGNOSIS — R059 Cough, unspecified: Secondary | ICD-10-CM | POA: Diagnosis not present

## 2023-03-16 DIAGNOSIS — R0981 Nasal congestion: Secondary | ICD-10-CM | POA: Diagnosis not present

## 2023-05-10 DIAGNOSIS — R040 Epistaxis: Secondary | ICD-10-CM | POA: Diagnosis not present

## 2023-05-10 DIAGNOSIS — R6883 Chills (without fever): Secondary | ICD-10-CM | POA: Diagnosis not present

## 2023-05-10 DIAGNOSIS — R0981 Nasal congestion: Secondary | ICD-10-CM | POA: Diagnosis not present

## 2023-05-10 DIAGNOSIS — R059 Cough, unspecified: Secondary | ICD-10-CM | POA: Diagnosis not present

## 2023-05-10 DIAGNOSIS — R1110 Vomiting, unspecified: Secondary | ICD-10-CM | POA: Diagnosis not present

## 2023-06-18 ENCOUNTER — Encounter (HOSPITAL_BASED_OUTPATIENT_CLINIC_OR_DEPARTMENT_OTHER): Payer: Self-pay | Admitting: Emergency Medicine

## 2023-06-18 ENCOUNTER — Other Ambulatory Visit: Payer: Self-pay

## 2023-06-18 ENCOUNTER — Emergency Department (HOSPITAL_BASED_OUTPATIENT_CLINIC_OR_DEPARTMENT_OTHER)

## 2023-06-18 ENCOUNTER — Emergency Department (HOSPITAL_BASED_OUTPATIENT_CLINIC_OR_DEPARTMENT_OTHER)
Admission: EM | Admit: 2023-06-18 | Discharge: 2023-06-18 | Disposition: A | Discharge status: Home / Self Care | Attending: Emergency Medicine | Admitting: Emergency Medicine

## 2023-06-18 DIAGNOSIS — R079 Chest pain, unspecified: Secondary | ICD-10-CM | POA: Diagnosis not present

## 2023-06-18 DIAGNOSIS — J45909 Unspecified asthma, uncomplicated: Secondary | ICD-10-CM | POA: Diagnosis not present

## 2023-06-18 DIAGNOSIS — R0781 Pleurodynia: Secondary | ICD-10-CM | POA: Insufficient documentation

## 2023-06-18 DIAGNOSIS — R0989 Other specified symptoms and signs involving the circulatory and respiratory systems: Secondary | ICD-10-CM | POA: Diagnosis not present

## 2023-06-18 LAB — BASIC METABOLIC PANEL WITH GFR
Anion gap: 9 (ref 5–15)
BUN: 12 mg/dL (ref 6–20)
CO2: 27 mmol/L (ref 22–32)
Calcium: 8.9 mg/dL (ref 8.9–10.3)
Chloride: 100 mmol/L (ref 98–111)
Creatinine, Ser: 1.24 mg/dL (ref 0.61–1.24)
GFR, Estimated: >60 mL/min (ref >60)
Glucose, Bld: 102 mg/dL — ABNORMAL HIGH (ref 70–99)
Potassium: 3.2 mmol/L — ABNORMAL LOW (ref 3.5–5.1)
Sodium: 136 mmol/L (ref 135–145)

## 2023-06-18 LAB — CBC
HCT: 41.4 % (ref 39.0–52.0)
Hemoglobin: 14.1 g/dL (ref 13.0–17.0)
MCH: 29.6 pg (ref 26.0–34.0)
MCHC: 34.1 g/dL (ref 30.0–36.0)
MCV: 87 fL (ref 80.0–100.0)
Platelets: 250 10*3/uL (ref 150–400)
RBC: 4.76 MIL/uL (ref 4.22–5.81)
RDW: 12.7 % (ref 11.5–15.5)
WBC: 11.5 10*3/uL — ABNORMAL HIGH (ref 4.0–10.5)
nRBC: 0 % (ref 0.0–0.2)

## 2023-06-18 LAB — D-DIMER, QUANTITATIVE: D-Dimer, Quant: 0.29 ug{FEU}/mL (ref 0.00–0.50)

## 2023-06-18 MED ORDER — KETOROLAC TROMETHAMINE 15 MG/ML IJ SOLN
30.0000 mg | Freq: Once | INTRAMUSCULAR | Status: AC
  Administered 2023-06-18: 30 mg via INTRAVENOUS
  Filled 2023-06-18: qty 2

## 2023-06-18 MED ORDER — METHOCARBAMOL 500 MG PO TABS: 500.0000 mg | ORAL_TABLET | Freq: Three times a day (TID) | ORAL | 0 refills | Status: AC | PRN

## 2023-06-18 MED ORDER — NAPROXEN 500 MG PO TABS
500.0000 mg | ORAL_TABLET | Freq: Two times a day (BID) | ORAL | 0 refills | Status: AC
Start: 2023-06-18 — End: ?

## 2023-06-18 MED ORDER — LIDOCAINE 5 % EX PTCH: 1.0000 | MEDICATED_PATCH | CUTANEOUS | 0 refills | Status: AC

## 2023-06-18 NOTE — ED Provider Notes (Signed)
 [facility]GENCY DEPT [facility] Emergency Department Provider Note MRN:  [id number]  Arrival date & time: 06/18/23     Chief Complaint   Chest Pain   History of Present Illness   Kevin Bryan is a 28 y.o. year-old male with no pertinent past medical history presenting to the ED with chief complaint of chest pain.  Pain to the right anterior and lateral ribs much worse with deep breathing worsening over the past day or 2.  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  Review of Systems  A thorough review of systems was obtained and all systems are negative except as noted in the HPI and PMH.   Patient's Health History    Past Medical History:  Diagnosis Date   Asthma    Hypospadias    Nausea and vomiting    Varicella     Past Surgical History:  Procedure Laterality Date   ADENOIDECTOMY     ESOPHAGOGASTRODUODENOSCOPY  03/01/2012   Procedure: ESOPHAGOGASTRODUODENOSCOPY (EGD);  Surgeon: Jon Gills, MD;  Location: [facility] OR;  Service: Gastroenterology;  Laterality: N/A;   TONSILLECTOMY      Family History  Problem Relation Age of Onset   Learning disabilities Sister    Cholelithiasis Maternal Aunt    Asthma Mother    Depression Mother    Miscarriages / Stillbirths Mother    Asthma Maternal Grandmother    Cancer Maternal Grandmother    Heart disease Maternal Grandmother    Diabetes Maternal Grandmother    Alcohol abuse Maternal Grandfather    Cancer Maternal Grandfather    Arthritis Maternal Grandfather    Heart disease Maternal Grandfather    Diabetes Maternal Grandfather    Hyperlipidemia Maternal Grandfather    Hypertension Maternal Grandfather     Social History   Socioeconomic History   Marital status: Married    Spouse name: Not on file   Number of children: Not on file   Years of education: Not on file   Highest education level: Not on file   Occupational History   Not on file  Tobacco Use   Smoking status: Never    Passive exposure: Yes   Smokeless tobacco: Never   Tobacco comments:    mom smokes   Vaping Use   Vaping status: Never Used  Substance and Sexual Activity   Alcohol use: Yes    Alcohol/week: 0.0 standard drinks of alcohol   Drug use: Yes    Types: Marijuana    Comment: Tried marijuana once age 67   Sexual activity: Not on file    Comment: uses condoms  Other Topics Concern   Not on file  Social History Narrative   Lives at home with mom, mom's boyfriend, and grandfather. Sister lives with boyfriend. Graduated from high school.   Social Drivers of Corporate investment banker Strain: Not on file  Food Insecurity: Not on file  Transportation Needs: Not on file  Physical Activity: Not on file  Stress: Not on file  Social Connections: Unknown (07/25/2021)   Received from [facility], [facility]   Social Network    Social Network: Not on file  Intimate Partner Violence: Unknown (06/16/2021)   Received from [facility], [facility]   HITS    Physically Hurt: Not on file    Insult or Talk Down To: Not on file  Threaten Physical Harm: Not on file    Scream or Curse: Not on file     Physical Exam   Vitals:   06/18/23 0145  BP: 130/83  Pulse: 70  Resp: 20  Temp: 98.4 F (36.9 C)  SpO2: 100%    CONSTITUTIONAL: Well-appearing, NAD NEURO/PSYCH:  Alert and oriented x 3, no focal deficits EYES:  eyes equal and reactive ENT/NECK:  no LAD, no JVD CARDIO: Regular rate, well-perfused, normal S1 and S2 PULM:  CTAB no wheezing or rhonchi GI/GU:  non-distended, non-tender MSK/SPINE:  No gross deformities, no edema SKIN:  no rash, atraumatic   *Additional and/or pertinent findings included in MDM below  Diagnostic and Interventional Summary    EKG Interpretation Date/Time:  Monday June 18 2023 01:58:47 EDT Ventricular Rate:  62 PR Interval:  156 QRS Duration:  106 QT  Interval:  399 QTC Calculation: 406 R Axis:   97  Text Interpretation: Sinus arrhythmia Borderline right axis deviation ST elev, probable normal early repol pattern Confirmed by Kennis Carina [phone]) on 06/18/2023 2:07:28 AM       Labs Reviewed  CBC - Abnormal; Notable for the following components:      Result Value   WBC 11.5 (*)    All other components within normal limits  BASIC METABOLIC PANEL WITH GFR - Abnormal; Notable for the following components:   Potassium 3.2 (*)    Glucose, Bld 102 (*)    All other components within normal limits  D-DIMER, QUANTITATIVE    DG Chest Port 1 View  Final Result      Medications  ketorolac (TORADOL) 15 MG/ML injection 30 mg (30 mg Intravenous Given 06/18/23 0233)     Procedures  /  Critical Care Procedures  ED Course and Medical Decision Making  Initial Impression and Ddx Patient sitting in no acute distress, seems a bit uncomfortable due to the pain.  With the pleuritic nature of the pain considering MSK versus pneumothorax versus less likely PE.  Says blood clots run in his family.  EKG demonstrating S1Q3T3 pattern however patient is not tachycardic or hypoxic, no evidence of DVT, will obtain D-dimer, chest x-ray.  Past medical/surgical history that increases complexity of ED encounter: None  Interpretation of Diagnostics I personally reviewed the EKG and my interpretation is as follows: Sinus rhythm, occasional PAC  No significant blood count or electrolyte disturbance.  D-dimer negative.  Patient Reassessment and Ultimate Disposition/Management     Patient feeling a lot better, vitals remain normal, appropriate for discharge.  Patient management required discussion with the following services or consulting groups:  None  Complexity of Problems Addressed Acute illness or injury that poses threat of life of bodily function  Additional Data Reviewed and Analyzed Further history obtained from: None  Additional Factors  Impacting ED Encounter Risk Prescriptions  Elmer Sow. Pilar Plate, MD [facility] Emergency Medicine [facility] [email or web address]  Final Clinical Impressions(s) / ED Diagnoses     ICD-10-CM   1. Pleuritic chest pain  R07.81       ED Discharge Orders          Ordered    methocarbamol (ROBAXIN) 500 MG tablet  Every 8 hours PRN        06/18/23 0350    naproxen (NAPROSYN) 500 MG tablet  2 times daily        06/18/23 0350    lidocaine (LIDODERM) 5 %  Every 24 hours        06/18/23 0350  Discharge Instructions Discussed with and Provided to Patient:     Discharge Instructions      You were evaluated in the Emergency Department and after careful evaluation, we did not find any emergent condition requiring admission or further testing in the hospital.  Your exam/testing today is overall reassuring.  Symptoms likely due to muscle strain or spasm.  Recommend using the Naprosyn twice daily as prescribed for pain.  Can use the Robaxin muscle relaxer for more significant pain, best used at night if you are having trouble sleeping as it can cause drowsiness.  Can also use the numbing patches daily.  Please return to the Emergency Department if you experience any worsening of your condition.   Thank you for allowing [place] to be a part of your care.       Sabas Sous, MD 06/18/23 [phone]

## 2023-06-18 NOTE — ED Triage Notes (Signed)
"  R side lung/rib cage pain when I breathe in. Its more positional than movement. 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 No chest trauma. Pt describes the pain as sharp, states it radiates to neck and shoulder on the R, rates pain 6/10 currently but states it has been worse than that at times.

## 2023-06-18 NOTE — Discharge Instructions (Signed)
 You were evaluated in the Emergency Department and after careful evaluation, we did not find any emergent condition requiring admission or further testing in the hospital.  Your exam/testing today is overall reassuring.  Symptoms likely due to muscle strain or spasm.  Recommend using the Naprosyn twice daily as prescribed for pain.  Can use the Robaxin muscle relaxer for more significant pain, best used at night if you are having trouble sleeping as it can cause drowsiness.  Can also use the numbing patches daily.  Please return to the Emergency Department if you experience any worsening of your condition.   Thank you for allowing [place] to be a part of your care.

## 2023-11-20 DIAGNOSIS — R062 Wheezing: Secondary | ICD-10-CM | POA: Diagnosis not present

## 2023-11-20 DIAGNOSIS — R0981 Nasal congestion: Secondary | ICD-10-CM | POA: Diagnosis not present

## 2023-11-20 DIAGNOSIS — R070 Pain in throat: Secondary | ICD-10-CM | POA: Diagnosis not present

## 2023-11-20 DIAGNOSIS — R051 Acute cough: Secondary | ICD-10-CM | POA: Diagnosis not present

## 2024-01-27 DIAGNOSIS — J029 Acute pharyngitis, unspecified: Secondary | ICD-10-CM | POA: Diagnosis not present

## 2024-01-27 DIAGNOSIS — R059 Cough, unspecified: Secondary | ICD-10-CM | POA: Diagnosis not present

## 2024-01-27 DIAGNOSIS — R0981 Nasal congestion: Secondary | ICD-10-CM | POA: Diagnosis not present

## 2024-02-06 DIAGNOSIS — R051 Acute cough: Secondary | ICD-10-CM | POA: Diagnosis not present

## 2024-02-06 DIAGNOSIS — R0981 Nasal congestion: Secondary | ICD-10-CM | POA: Diagnosis not present

## 2024-02-07 ENCOUNTER — Emergency Department (HOSPITAL_BASED_OUTPATIENT_CLINIC_OR_DEPARTMENT_OTHER)
Admission: EM | Admit: 2024-02-07 | Discharge: 2024-02-07 | Disposition: A | Discharge status: Home / Self Care | Attending: Emergency Medicine | Admitting: Emergency Medicine

## 2024-02-07 ENCOUNTER — Other Ambulatory Visit: Payer: Self-pay

## 2024-02-07 DIAGNOSIS — J111 Influenza due to unidentified influenza virus with other respiratory manifestations: Secondary | ICD-10-CM | POA: Diagnosis not present

## 2024-02-07 DIAGNOSIS — J45909 Unspecified asthma, uncomplicated: Secondary | ICD-10-CM | POA: Insufficient documentation

## 2024-02-07 DIAGNOSIS — R509 Fever, unspecified: Secondary | ICD-10-CM | POA: Diagnosis present

## 2024-02-07 LAB — RESP PANEL BY RT-PCR (RSV, FLU A&B, COVID)  RVPGX2
Influenza A by PCR: POSITIVE — AB
Influenza B by PCR: NEGATIVE
Resp Syncytial Virus by PCR: NEGATIVE
SARS Coronavirus 2 by RT PCR: NEGATIVE

## 2024-02-07 MED ORDER — ONDANSETRON HCL 4 MG PO TABS: 4.0000 mg | ORAL_TABLET | Freq: Four times a day (QID) | ORAL | 0 refills | Status: AC

## 2024-02-07 NOTE — ED Triage Notes (Signed)
 Pt from home via POV.  Pt reports testing positive for Flu A with at home test.  Pt reports /N /V x3, cough, joint pain, fever 102.2, congestion, HA.

## 2024-02-07 NOTE — Discharge Instructions (Addendum)
 You were given a prescription for Zofran , this medication should help with nausea, please take this medication as prescribed.  Please continue to try to hydrate with plenty of fluids.  Please alternate Tylenol  versus ibuprofen  every 4 hours.

## 2024-02-07 NOTE — ED Provider Notes (Signed)
 Kevin Bryan EMERGENCY DEPARTMENT AT [facility] Provider Note   CSN: [id number] Arrival date & time: 02/07/24  2057     Patient presents with: Influenza   Kevin Bryan is a 28 y.o. male.   28 year old male with a past medical history of asthma presents to the ED with a chief complaint of bodyaches, cough, fevers for the past day.  Evaluated urgent care earlier, given promethazine, albuterol  inhaler to help with his symptoms.  He reports not being able to keep anything down.  His significant other at the bedside is asking for a Murphy's cocktail , she states this is a IV with vitamins.  I discussed with her that we do not have IVs with vitamins infused.  Patient has had a fever, he did receive some Tylenol  by his significant other at the bedside.  He reports he did not have a influenza vaccine this season.  Also having lack of oral intake.  No chest pain or shortness of breath.  The history is provided by the patient.  Influenza Presenting symptoms: fever, myalgias and nausea   Presenting symptoms: no shortness of breath and no vomiting   Associated symptoms: chills        Prior to Admission medications   Medication Sig Start Date End Date Taking? Authorizing Provider  ondansetron  (ZOFRAN ) 4 MG tablet Take 1 tablet (4 mg total) by mouth every 6 (six) hours for 7 days. 02/07/24 02/14/24 Yes Jacalynn Buzzell, PA-C  albuterol  (VENTOLIN  HFA) 108 (90 Base) MCG/ACT inhaler Inhale 2 puffs into the lungs every 6 (six) hours as needed for wheezing or shortness of breath. 11/20/18   Frann Mabel Mt, DO  amoxicillin -clavulanate (AUGMENTIN ) 875-125 MG tablet Take 1 tablet by mouth every 12 (twelve) hours. 02/15/21   Towana Ozell BROCKS, MD  doxycycline  (VIBRAMYCIN ) 100 MG capsule Take 1 capsule (100 mg total) by mouth 2 (two) times daily. 12/09/21   Logan Ubaldo NOVAK, PA-C  lidocaine  (LIDODERM ) 5 % Place 1 patch onto the skin daily. Remove & Discard patch within 12 hours or as directed  by MD 06/18/23   Theadore Ozell HERO, MD  methocarbamol  (ROBAXIN ) 500 MG tablet Take 1 tablet (500 mg total) by mouth every 8 (eight) hours as needed for muscle spasms. 06/18/23   Theadore Ozell HERO, MD  naproxen  (NAPROSYN ) 500 MG tablet Take 1 tablet (500 mg total) by mouth 2 (two) times daily. 06/18/23   Theadore Ozell HERO, MD    Allergies: Bee venom and Food    Review of Systems  Constitutional:  Positive for appetite change, chills and fever.  Respiratory:  Negative for shortness of breath.   Cardiovascular:  Negative for chest pain.  Gastrointestinal:  Positive for nausea. Negative for abdominal pain and vomiting.  Genitourinary:  Negative for flank pain.  Musculoskeletal:  Positive for myalgias. Negative for back pain.  All other systems reviewed and are negative.   Updated Vital Signs BP 122/79   Pulse (!) 103   Temp 99.1 F (37.3 C) (Oral)   Resp 17   Ht 5' 6 (1.676 m)   Wt 78 kg   SpO2 98%   BMI 27.76 kg/m   Physical Exam Vitals and nursing note reviewed.  Constitutional:      Appearance: He is ill-appearing.  HENT:     Head: Normocephalic and atraumatic.     Nose: Congestion present.     Mouth/Throat:     Mouth: Mucous membranes are moist.     Pharynx: Posterior oropharyngeal  erythema present.  Cardiovascular:     Rate and Rhythm: Normal rate.  Pulmonary:     Effort: Pulmonary effort is normal.     Breath sounds: No wheezing or rales.  Abdominal:     General: Abdomen is flat.     Palpations: Abdomen is soft.     Tenderness: There is no abdominal tenderness.  Musculoskeletal:     Cervical back: Normal range of motion and neck supple.  Skin:    General: Skin is warm and dry.  Neurological:     Mental Status: He is alert and oriented to person, place, and time.     (all labs ordered are listed, but only abnormal results are displayed) Labs Reviewed  RESP PANEL BY RT-PCR (RSV, FLU A&B, COVID)  [id number]    EKG: None  Radiology: No results found.   Procedures    Medications Ordered in the ED - No data to display                                  Medical Decision Making   Patient presented to the ED with a chief complaint of influenza like symptoms such as congestion, cough, body aches and chills.  Febrile with temperature of 102 at home, given Tylenol  by significant other.  He also had a albuterol  inhaler with some improvement in symptoms.  Reports he took a at home influenza test which was positive for influenza A.  He was retested here while in the emergency department we discussed that if the results were positive home that would likely be positive while in the ED therefore no need to wait on the test.  His vitals are within normal limits, his lungs are clear to auscultation with no wheezing rhonchi or rales.  His oropharynx is clear without any signs of PTA or tonsillar exudates.  His abdomen is soft.  He tells me that he has had decrease in oral intake, he is only received 1 dose of Tylenol  while at home, we discussed appropriately dosing his medication to help with the fever.  I did discuss with them Zofran  to help with nausea along with improve his oral intake.  Has significant other at the bedside is asking for a vitamin infused IV, I did discuss with her that we do not have these at this time, we do have vitamins such as thiamine to be given in the case of alcoholism.  I do feel that patient will need supportive treatment at home but is overall in stable condition for outpatient treatment.  Portions of this note were generated with Scientist, clinical (histocompatibility and immunogenetics). Dictation errors may occur despite best attempts at proofreading.   Final diagnoses:  Influenza-like illness    ED Discharge Orders          Ordered    ondansetron  (ZOFRAN ) 4 MG tablet  Every 6 hours        02/07/24 2124               Marrah Vanevery, PA-C 02/07/24 2129    Cottie Donnice PARAS, MD 02/07/24 2220

## 2024-02-07 NOTE — ED Notes (Signed)
 Aches pains cough and fevers are the complaints and symptoms.  Pt. Has been seen at [facility].  Pt. Has not had a flu shot.

## 2024-02-13 DIAGNOSIS — R059 Cough, unspecified: Secondary | ICD-10-CM | POA: Diagnosis not present

## 2024-02-13 DIAGNOSIS — R0981 Nasal congestion: Secondary | ICD-10-CM | POA: Diagnosis not present
